# Patient Record
Sex: Male | Born: 1965
Health system: Western US, Academic
[De-identification: ages and names within clinical notes are randomized; demographics above are authoritative.]

---

## 2019-08-30 IMAGING — MR MRI BRAIN WITHOUT CONTRAST
6 of 8 series · 29 of 48 positions shown · non-contrast
Comparison: none

Pertinent Hx:  Closed head injury.
TECHNIQUE: T1, T2, FLAIR, and gradient echo weighted axials, T1 weighted sagittals, and T2 weighted coronals are performed through the brain. Additional diffusion weighted images are performed.

[Series 3: T1 · sagittal · 5.0mm · 0.47mm/px · 4 of 24 slices shown (1 of 2)]
[im 1/24]
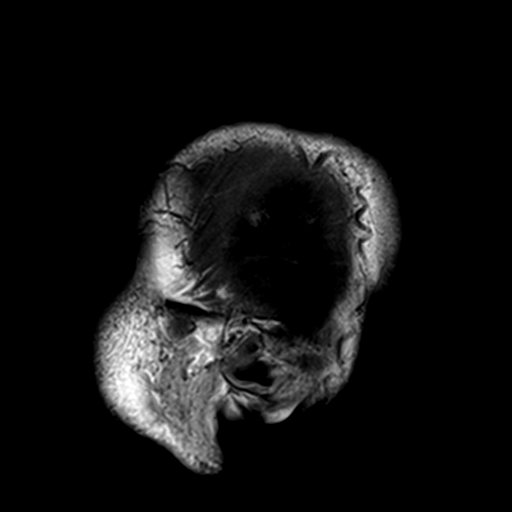
[im 8/24]
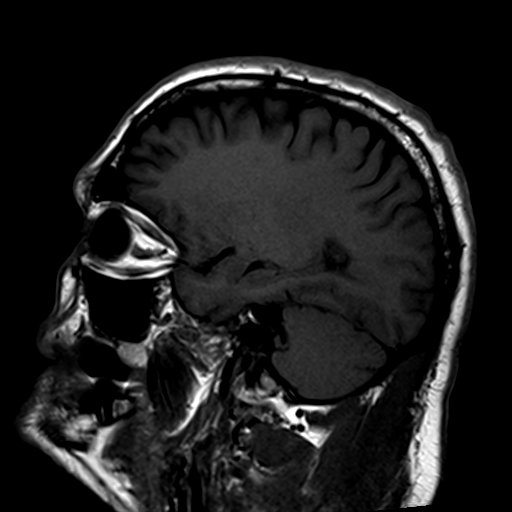
[im 16/24]
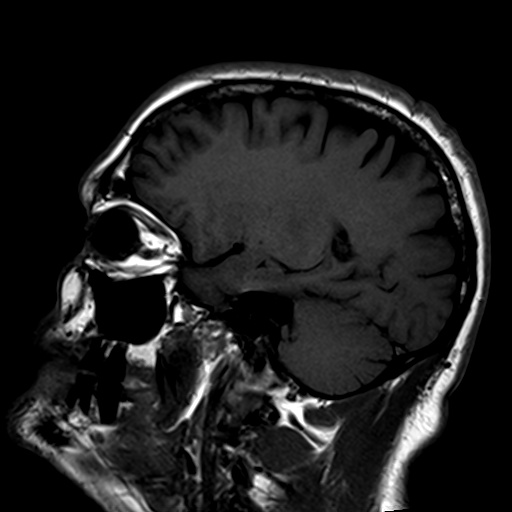
[im 24/24]
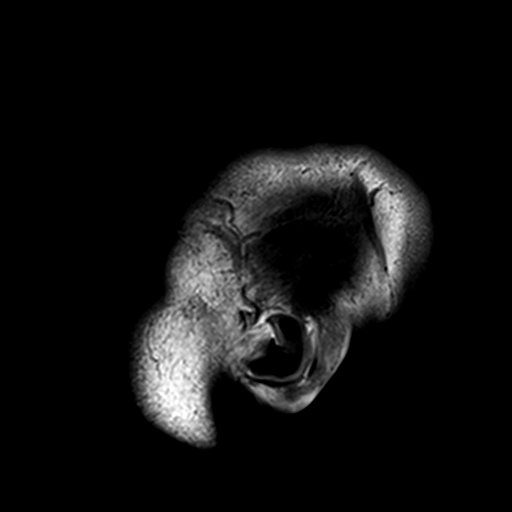

[Series 4: T2 · coronal · 5.0mm · 0.62mm/px · 5 of 28 slices shown (1 of 2)]
[im 1/28]
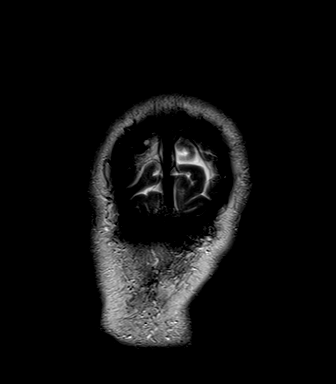
[im 7/28]
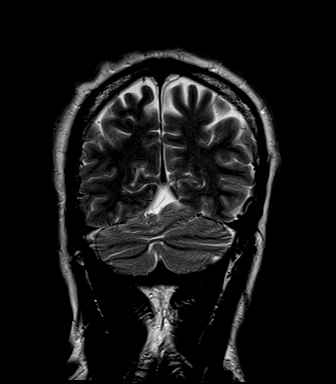
[im 14/28]
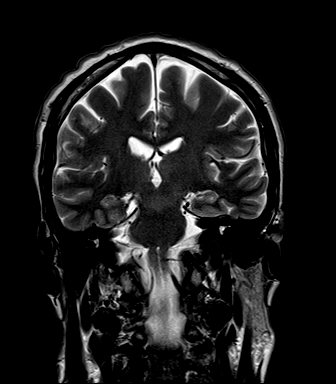
[im 21/28]
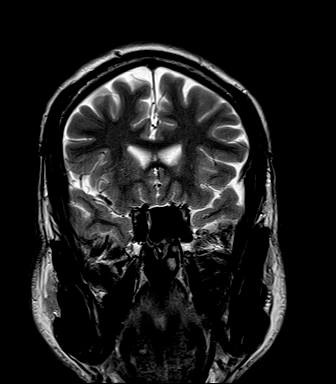
[im 28/28]
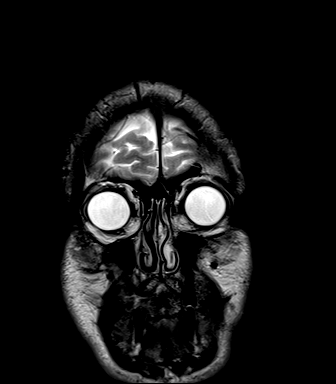

[Series 5: T2 · axial · 5.0mm · 0.62mm/px · z∈[-44,+86]mm · 5 of 26 slices shown (2 of 2)]
[im 1/26]
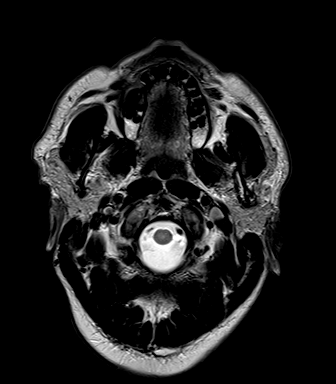
[im 7/26]
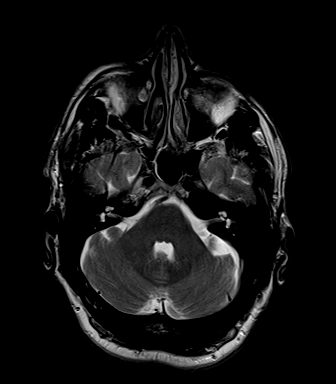
[im 13/26]
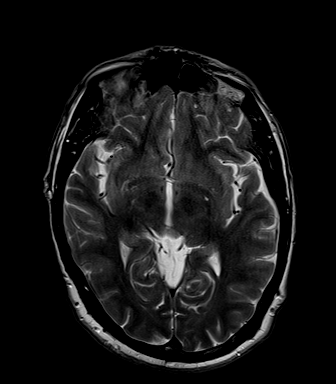
[im 19/26]
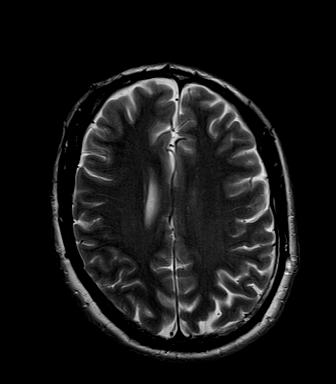
[im 26/26]
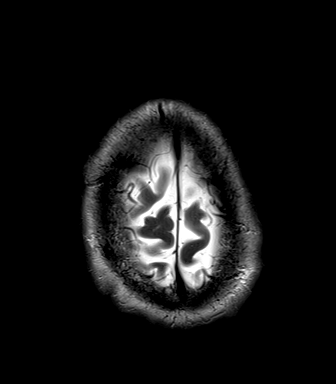

[Series 6: T1 · axial · 5.0mm · 0.62mm/px · z∈[-44,+86]mm · 5 of 26 slices shown (2 of 2)]
[im 1/26]
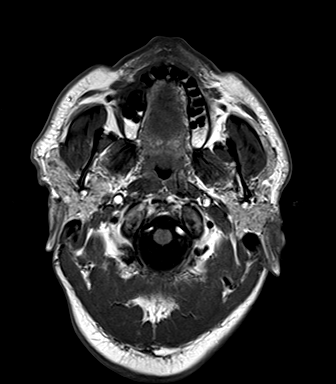
[im 7/26]
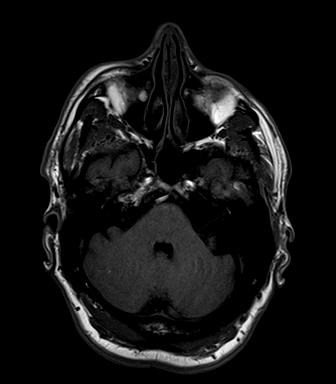
[im 13/26]
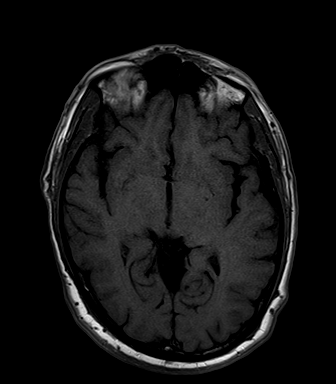
[im 19/26]
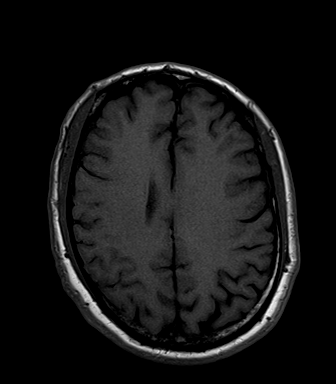
[im 26/26]
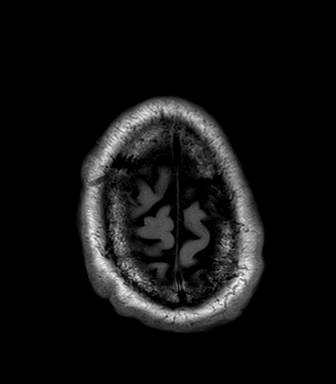

[Series 7: FLAIR · axial · 5.0mm · 0.47mm/px · z∈[-44,+86]mm · 5 of 26 slices shown]
[im 1/26]
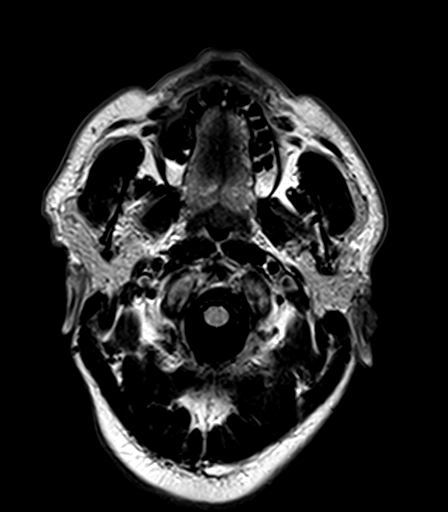
[im 7/26]
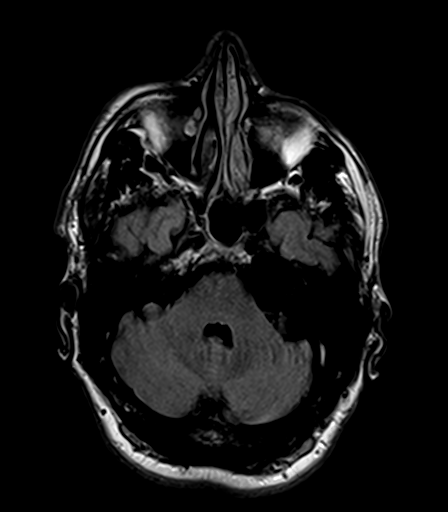
[im 13/26]
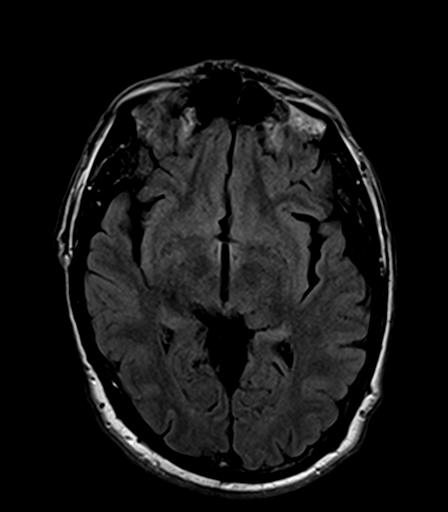
[im 19/26]
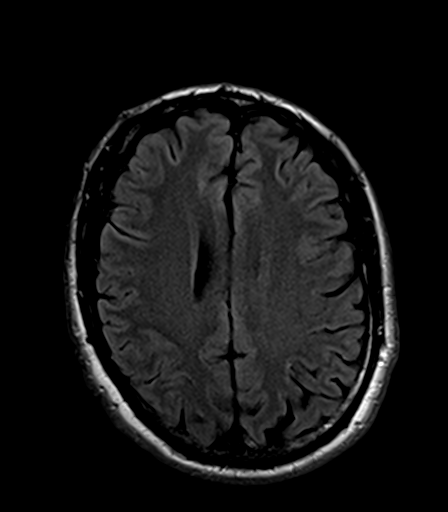
[im 26/26]
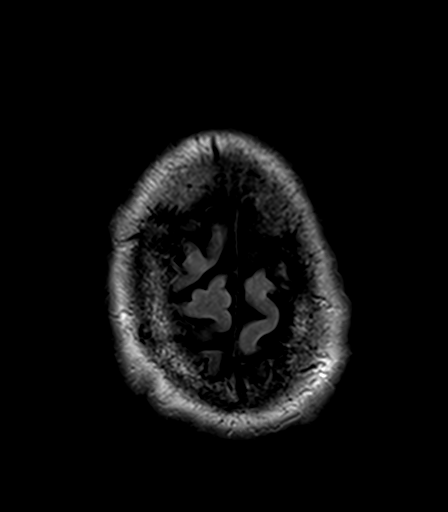

[Series 8: GRE · axial · 5.0mm · 0.47mm/px · z∈[-44,+86]mm · 5 of 26 slices shown]
[im 1/26]
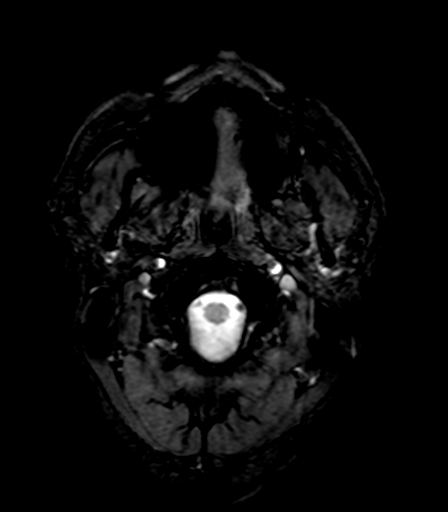
[im 7/26]
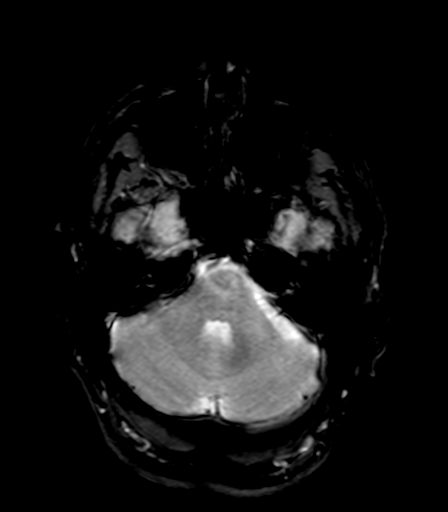
[im 13/26]
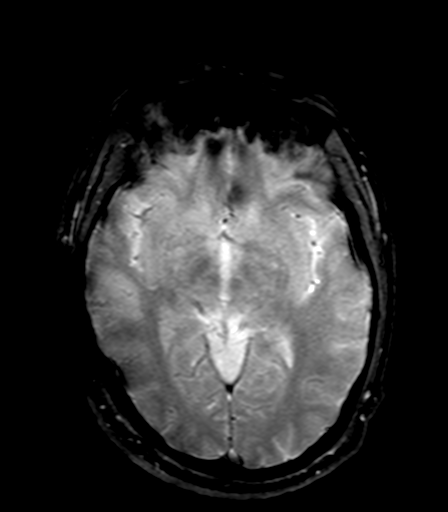
[im 19/26]
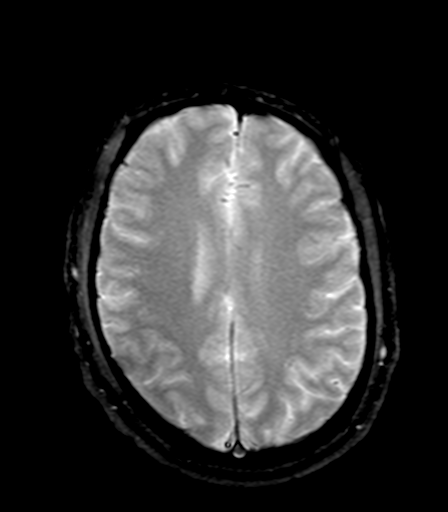
[im 26/26]
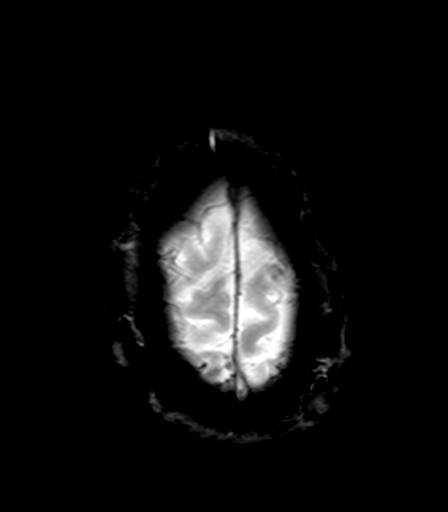

[29 of 48 positions shown; findings below may reference images not displayed]

FINDINGS: The fourth, third, and lateral ventricles are normal in size and location.  No intracranial abnormalities are seen.  Both eighth nerves are visualized and are normal.  The sinuses are clear.  Specifically, there is no evidence of recent brain injury.  

There is very mild frontal lobe atrophy with a mild degree of temporal lobe atrophy.  (Image 12 series 4.)
IMPRESSION: Essentially normal brain MRI with the exception of very mild atrophy in the frontal and temporal lobes slightly excessive that would be expected for a patient of age 54.

## 2020-02-20 IMAGING — MR MRI ABDOMEN WITHOUT CONTRAST
9 series · 48 of 48 positions shown · non-contrast
Comparison: none

﻿

Pertinent Hx:  Right-sided abdominal pain.
TECHNIQUE: Images were taken in axial and coronal planes.  T1 and T2-weighted images were obtained. In- and out-of-phase imaging was performed.  Diffusion-weighted images were also obtained.

[Series 1: 3 plane loc · axial · 7.0mm · 0.78mm/px · z∈[-30,+200]mm · 2 of 12 slices shown]
[im 1/12]
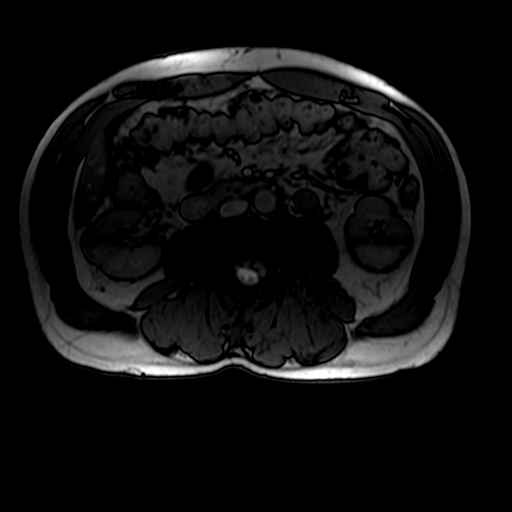
[im 12/12]
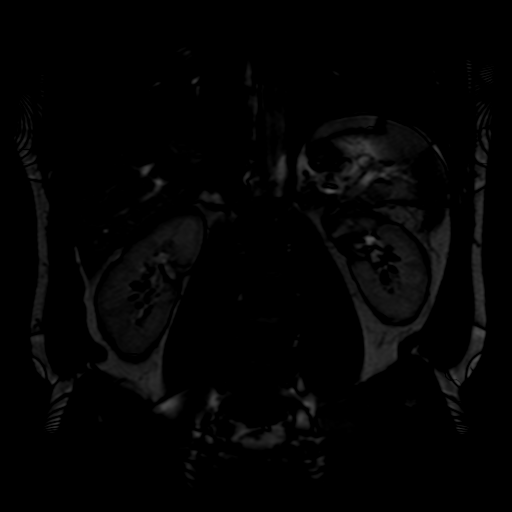

[Series 2: T2 · coronal · 6.0mm · 1.56mm/px · 4 of 32 slices shown (1 of 2)]
[im 1/32]
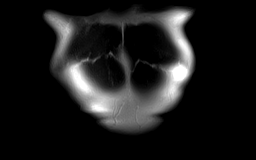
[im 11/32]
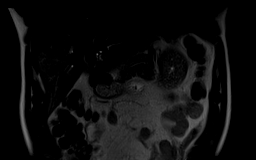
[im 21/32]
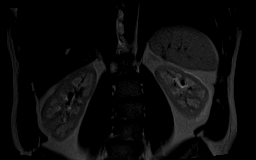
[im 32/32]
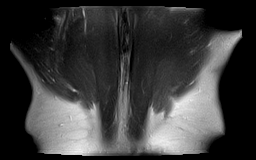

[Series 3: ax dual echo · axial · 5.0mm · 0.74mm/px · z∈[-111,+142]mm · 9 of 80 slices shown]
[im 1/80]
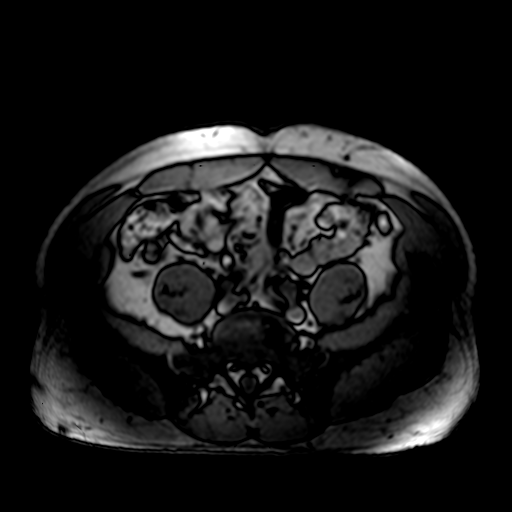
[im 10/80]
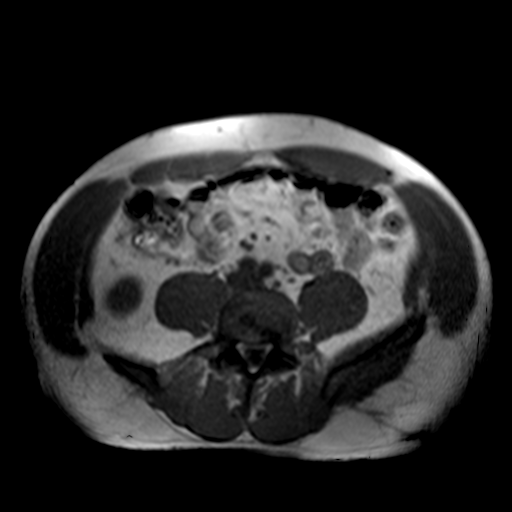
[im 20/80]
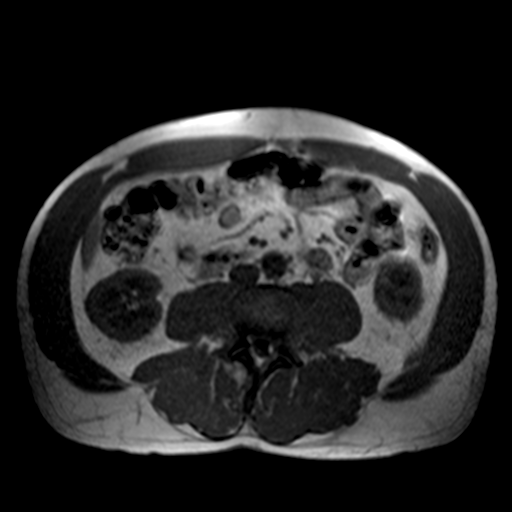
[im 30/80]
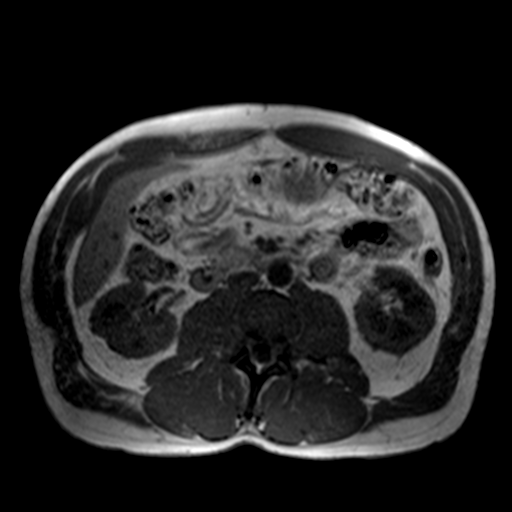
[im 40/80]
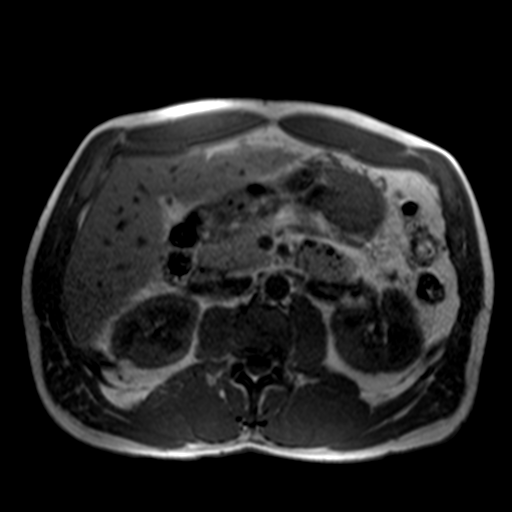
[im 50/80]
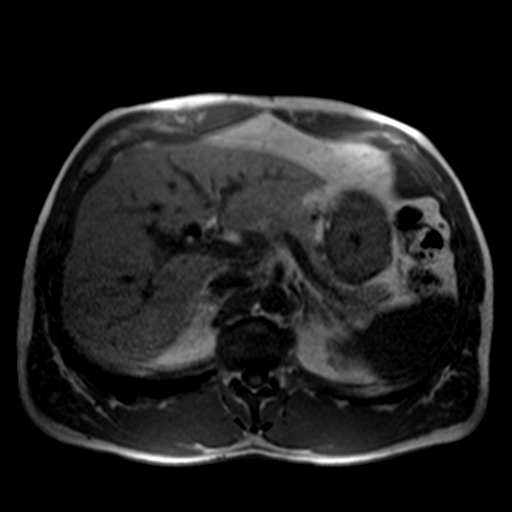
[im 60/80]
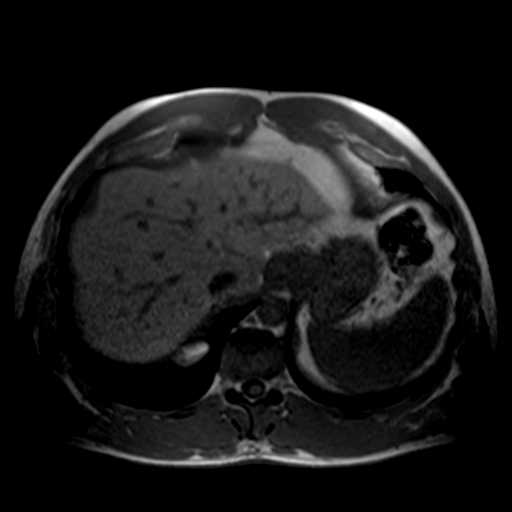
[im 70/80]
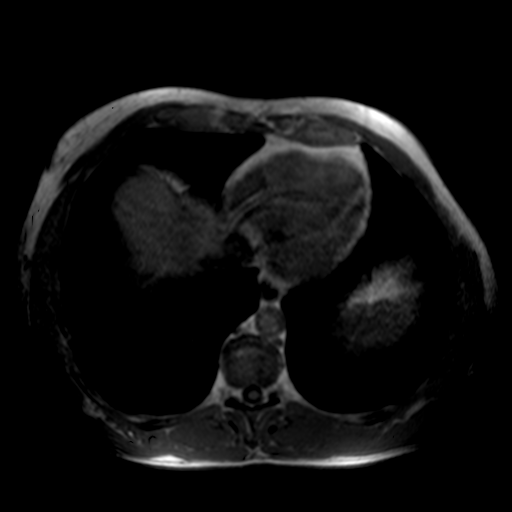
[im 80/80]
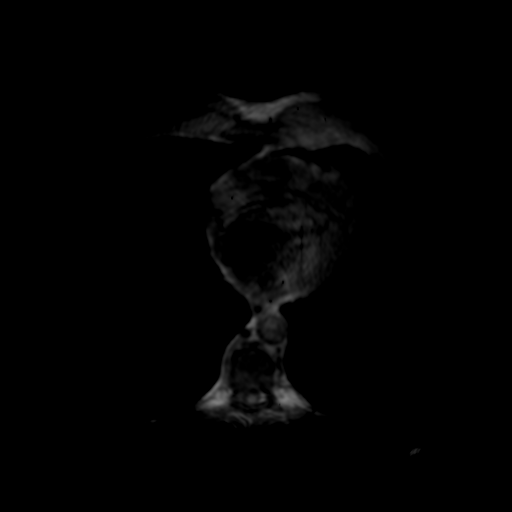

[Series 4: T2 · axial · 5.0mm · 1.48mm/px · z∈[-111,+142]mm · 4 of 40 slices shown (2 of 2)]
[im 1/40]
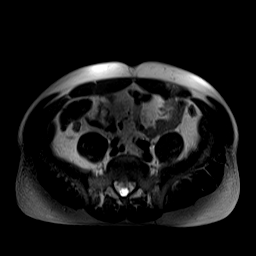
[im 14/40]
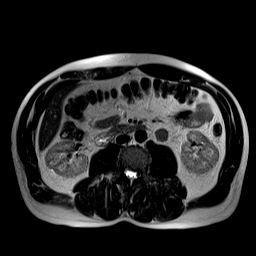
[im 27/40]
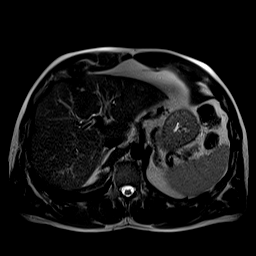
[im 40/40]
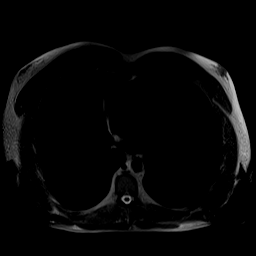

[Series 5: T2 fat-sat · axial · 5.0mm · 1.48mm/px · z∈[-111,+142]mm · 4 of 40 slices shown]
[im 1/40]
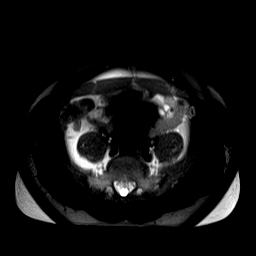
[im 14/40]
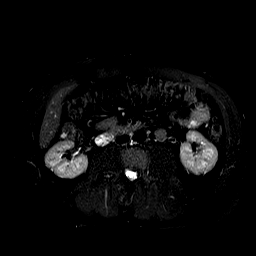
[im 27/40]
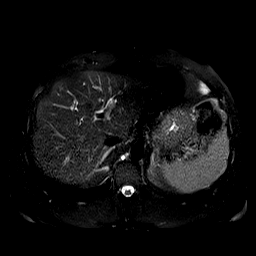
[im 40/40]
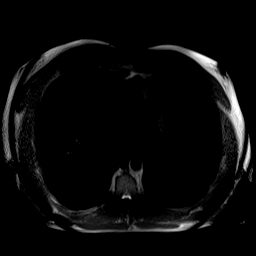

[Series 6: T1 dynamic · coronal · 4.0mm · 1.48mm/px · 6 of 60 slices shown (1 of 2)]
[im 1/60]
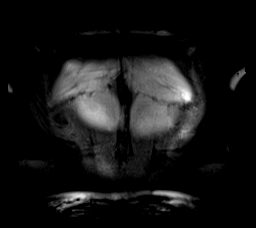
[im 12/60]
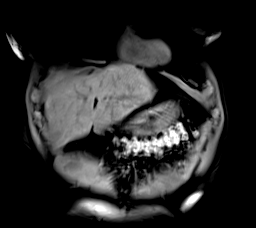
[im 24/60]
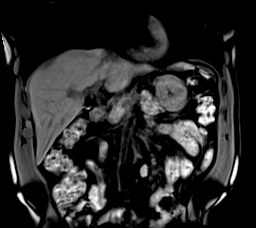
[im 36/60]
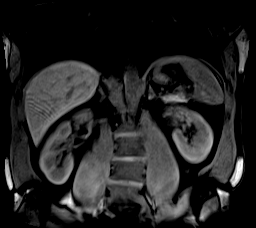
[im 48/60]
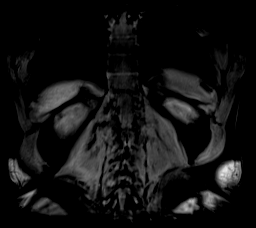
[im 60/60]
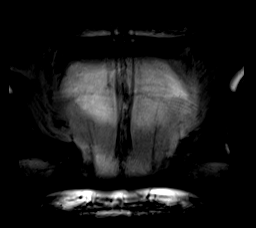

[Series 7: [id number]_800_(person_name)_(person_name)2_bh · axial · 5.0mm · 2.32mm/px · z∈[-111,+142]mm · 9 of 80 slices shown]
[im 1/80]
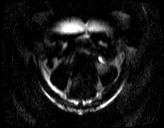
[im 10/80]
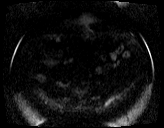
[im 20/80]
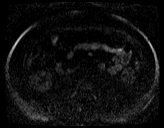
[im 30/80]
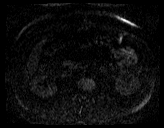
[im 40/80]
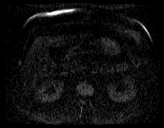
[im 50/80]
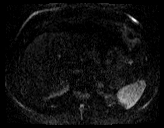
[im 60/80]
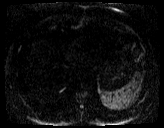
[im 70/80]
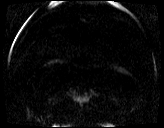
[im 80/80]
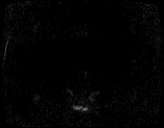

[Series 8: ep2d_[id number]_800_(person_name)_(person_name)2_bh_adc · axial · 5.0mm · 2.32mm/px · z∈[-111,+142]mm · 4 of 40 slices shown]
[im 1/40]
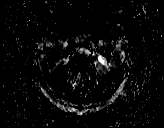
[im 14/40]
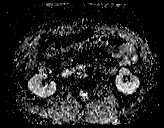
[im 27/40]
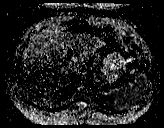
[im 40/40]
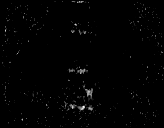

[Series 9: T1 dynamic · axial · 4.0mm · 1.48mm/px · z∈[-103,+133]mm · 6 of 60 slices shown (2 of 2)]
[im 1/60]
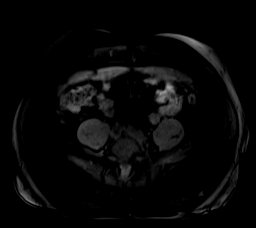
[im 12/60]
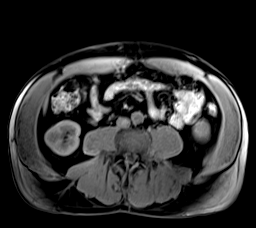
[im 24/60]
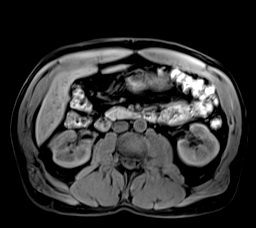
[im 36/60]
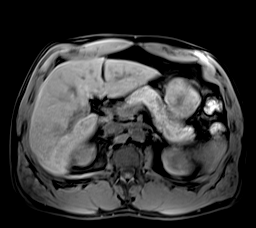
[im 48/60]
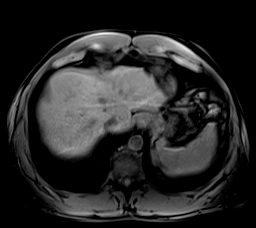
[im 60/60]
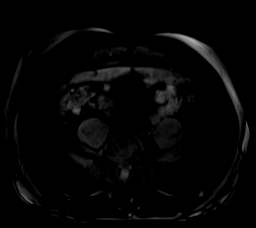

[48 of 48 positions shown; findings below may reference images not displayed]

FINDINGS: There is a small benign cyst measuring about 1 cm in diameter in the upper pole of the right kidney.  Kidneys otherwise appear normal.  No solid mass.  No hydronephrosis.  

Both adrenal glands appear normal.  

The gallbladder is absent.  There is a history of cholecystectomy.  There is no abnormal bile duct distention.  

The pancreas is normal.  The liver and spleen appear normal. 

No inflammatory changes identified in the upper abdomen.  

No retroperitoneal mass or lymphadenopathy.  

No evidence of ascites.
IMPRESSION: 1. Small benign cyst upper pole right kidney.

2. No other abnormality identified.  No acute findings.

## 2021-08-24 IMAGING — MR MRI SHOULDER LT W/O CONTRAST
7 series · 40 of 40 positions shown · non-contrast
Comparison: none

------------- REPORT [id number] -------------
﻿

Pertinent Hx:  Posterior shoulder and triceps pain.
TECHNIQUE: Proton density and T2-weighted coronal oblique images of the shoulder were taken.  Proton density sagittal oblique and axial images were also obtained.  Proton density and T2-weighted axial and proton density coronal images were taken of the proximal portion of the left upper extremity to about mid humeral level.

[Series 3: PD fat-sat · axial · 5.0mm · 0.64mm/px · z∈[-50,+56]mm · 5 of 20 slices shown (1 of 5)]
[im 1/20]
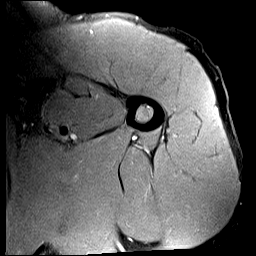
[im 5/20]
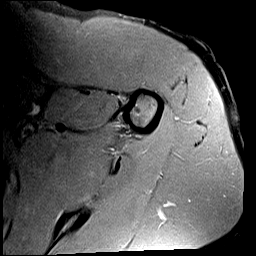
[im 10/20]
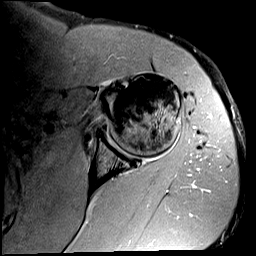
[im 15/20]
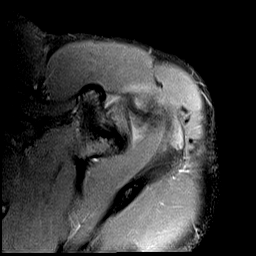
[im 20/20]
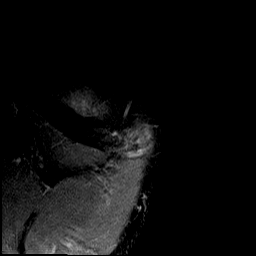

[Series 4: PD fat-sat · sagittal · 5.0mm · 0.66mm/px · 5 of 20 slices shown (2 of 5)]
[im 1/20]
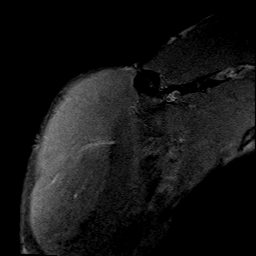
[im 5/20]
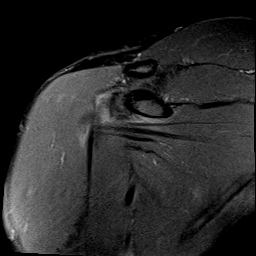
[im 10/20]
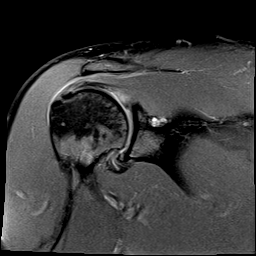
[im 15/20]
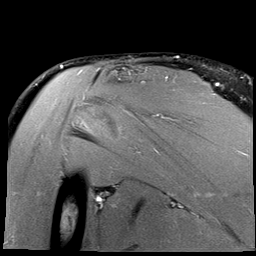
[im 20/20]
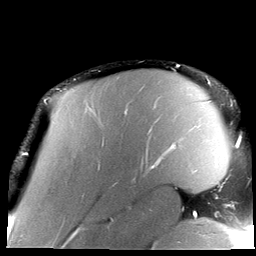

[Series 5: T2 · sagittal · 5.0mm · 0.53mm/px · 5 of 20 slices shown (1 of 2)]
[im 1/20]
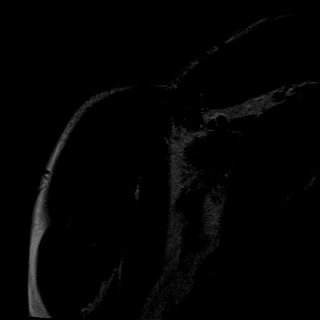
[im 5/20]
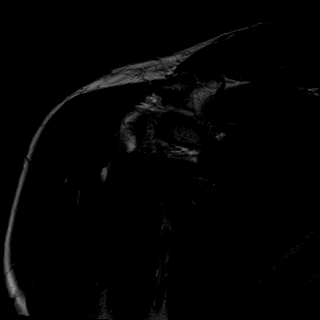
[im 10/20]
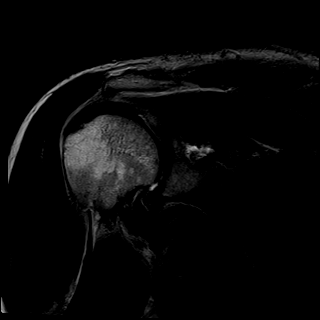
[im 15/20]
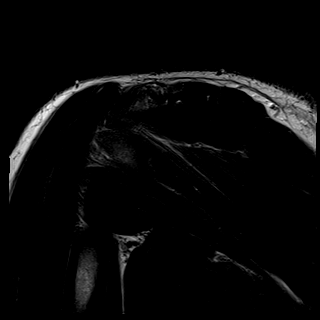
[im 20/20]
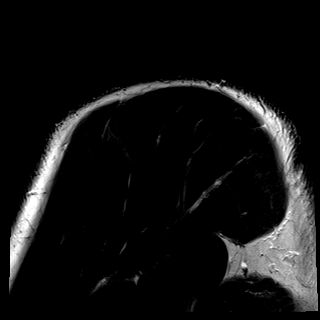

[Series 6: PD fat-sat · coronal · 5.0mm · 0.53mm/px · 6 of 23 slices shown (3 of 5)]
[im 1/23]
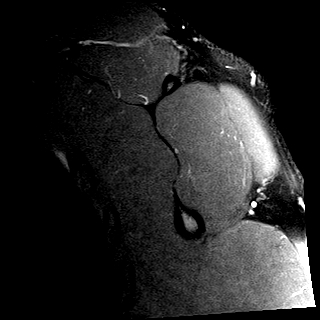
[im 5/23]
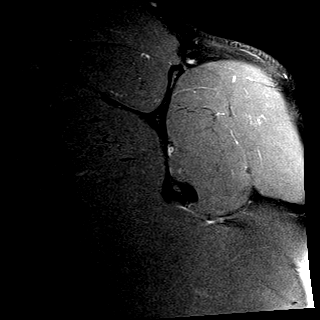
[im 9/23]
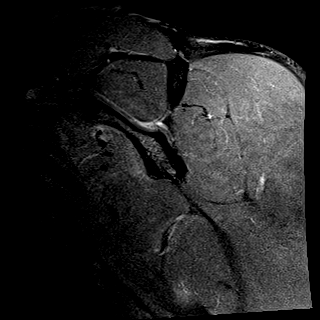
[im 14/23]
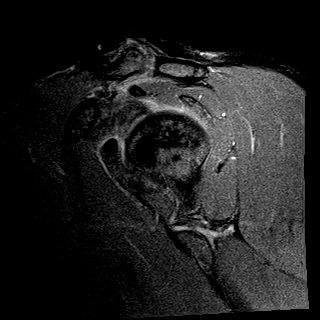
[im 18/23]
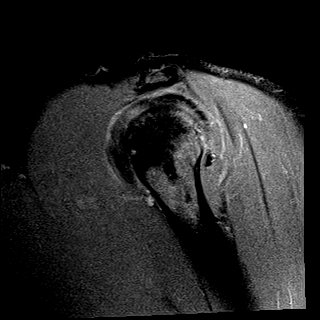
[im 23/23]
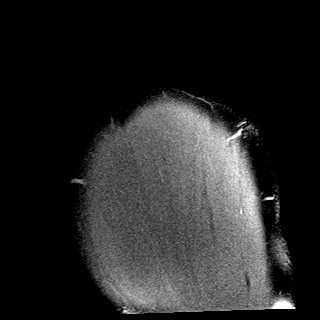

[Series 9: PD fat-sat · axial · 6.0mm · 0.70mm/px · z∈[-123,+49]mm · 7 of 25 slices shown (4 of 5)]
[im 1/25]
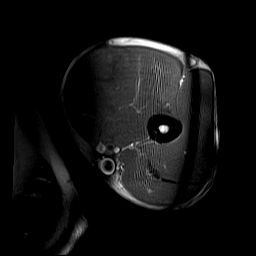
[im 5/25]
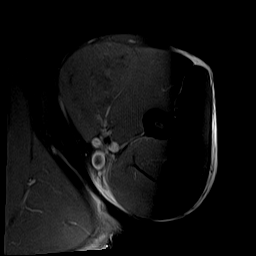
[im 9/25]
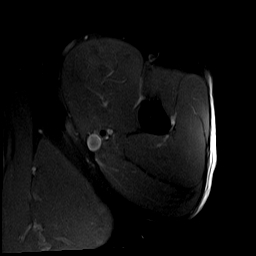
[im 13/25]
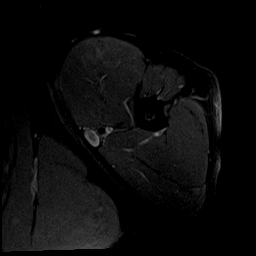
[im 17/25]
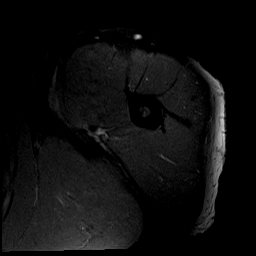
[im 21/25]
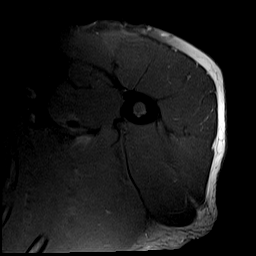
[im 25/25]
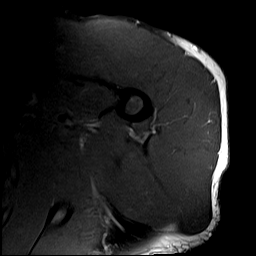

[Series 10: T2 · axial · 6.0mm · 0.56mm/px · z∈[-123,+49]mm · 7 of 25 slices shown (2 of 2)]
[im 1/25]
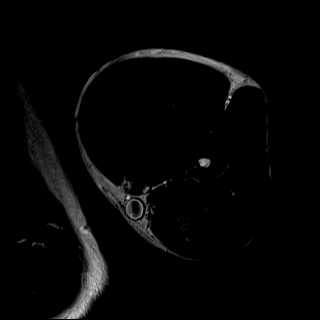
[im 5/25]
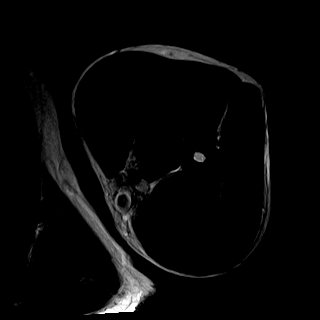
[im 9/25]
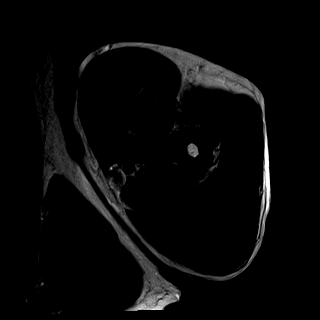
[im 13/25]
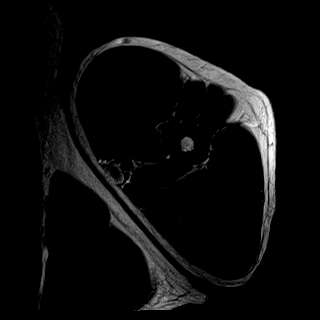
[im 17/25]
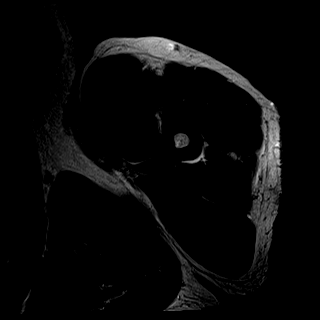
[im 21/25]
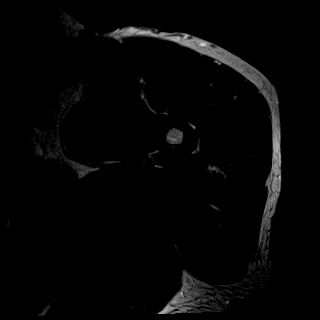
[im 25/25]
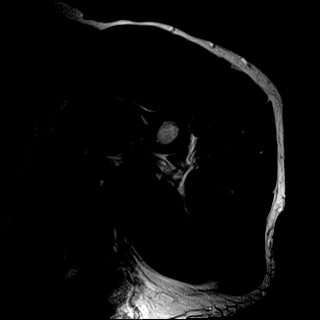

[Series 11: PD fat-sat · coronal · 6.0mm · 0.62mm/px · 5 of 20 slices shown (5 of 5)]
[im 1/20]
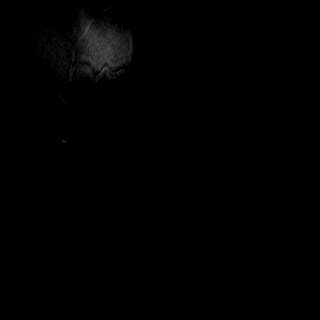
[im 5/20]
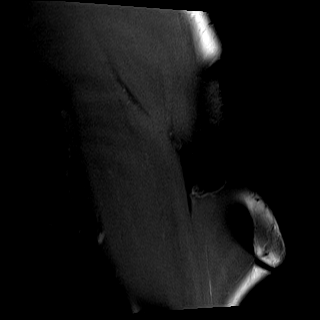
[im 10/20]
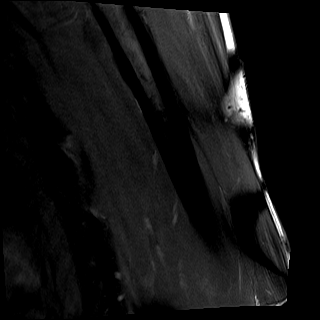
[im 15/20]
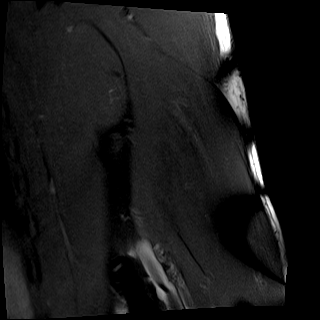
[im 20/20]
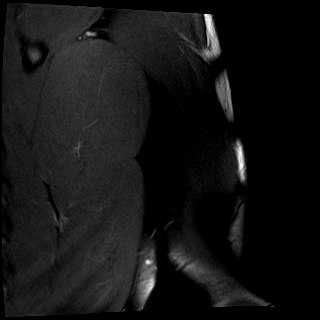

[40 of 40 positions shown; findings below may reference images not displayed]

FINDINGS: There is a shoulder joint effusion.  There is fluid within the subacromial-subdeltoid bursa.

There is a tear at the base of the posterior glenoid labrum.  This extends from the superior labrum to the inferior aspect of the joint and extends along the base of the inferior glenoid labrum.  There is no displacement.  

The biceps tendon appears intact and not torn.  

There is abnormal signal within the supraspinatus tendon at the humeral attachment.  This is consistent with tendinopathy.  There is a short, partial tear of the tendon just a few millimeters in length.  There is no evidence of complete tendon rupture.  Other rotator cuff tendons appear normal.  

No chondral defect identified at the glenohumeral joint.  No bony abnormality.  

There are acromioclavicular joint degenerative changes.  There is some reactive edematous change surrounding the AC joint.  

No muscular abnormality around the shoulder or in the proximal portion of the left upper extremity identified.  There is no muscular edema.  There is no atrophy.  

No other abnormality identified.  

Impression on page two
IMPRESSION: 1. Shoulder joint effusion.  Fluid in the subacromial-subdeltoid bursa.  

2. Tear at the base of the posterior glenoid labrum.  Extension of tear along the base of the inferior glenoid labrum without displacement.  

3. Supraspinatus tendinopathy.  Short, partial tear of the supraspinatus tendon.

------------- REPORT [id number] -------------
﻿

ADDENDUM:  

No abnormality in the proximal portions of the triceps muscle and latissimus dorsi can be identified.  Muscles show normal signal intensity.   

Pertinent Hx:  Posterior shoulder and triceps pain.
FINDINGS: There is a shoulder joint effusion.  There is fluid within the subacromial-subdeltoid bursa.

There is a tear at the base of the posterior glenoid labrum.  This extends from the superior labrum to the inferior aspect of the joint and extends along the base of the inferior glenoid labrum.  There is no displacement.  

The biceps tendon appears intact and not torn.  

There is abnormal signal within the supraspinatus tendon at the humeral attachment.  This is consistent with tendinopathy.  There is a short, partial tear of the tendon just a few millimeters in length.  There is no evidence of complete tendon rupture.  Other rotator cuff tendons appear normal.  

No chondral defect identified at the glenohumeral joint.  No bony abnormality.  

There are acromioclavicular joint degenerative changes.  There is some reactive edematous change surrounding the AC joint.  

No muscular abnormality around the shoulder or in the proximal portion of the left upper extremity identified.  There is no muscular edema.  There is no atrophy.  

No other abnormality identified.  

Impression on page two
IMPRESSION: 1. Shoulder joint effusion.  Fluid in the subacromial-subdeltoid bursa.  

2. Tear at the base of the posterior glenoid labrum.  Extension of tear along the base of the inferior glenoid labrum without displacement.  

3. Supraspinatus tendinopathy.  Short, partial tear of the supraspinatus tendon.

## 2021-12-22 ENCOUNTER — Telehealth: Payer: PRIVATE HEALTH INSURANCE

## 2021-12-22 NOTE — Telephone Encounter
Appointment Accommodation Request      Appointment Type: New Prostate Mass    Reason for sooner request: Pt interested in see Dr. Luetta Nutting. Pt requesting a VV appt if possible due to being out of state in [place]. Pt was diagnosed with a cancerous prostate tumor a couple of days ago. Pt will be faxing over medical records today 12/19/21.    Date/Time Requested (If any): VV if possible    Last seen by MD:     Any Symptoms:  '[x]'$  Yes  '[]'$  No       If yes, what symptoms are you experiencing:  cancerous prostate tumor  o Duration of symptoms (how long): a week ago    Patient or caller was offered an appointment but declined.    Patient or caller was advised to seek emergency services if conditions are urgent or emergent.    Patient or caller has been notified of the turnaround time of 1-2 business (days).

## 2022-01-10 ENCOUNTER — Telehealth: Payer: PRIVATE HEALTH INSURANCE

## 2022-01-10 DIAGNOSIS — C61 Malignant neoplasm of prostate: Secondary | ICD-10-CM

## 2022-01-10 NOTE — Consults
PATIENT: Jeremy Doyle  MRN: [id number]  DOB: Sep 09, 1965  DATE OF SERVICE: 01/10/2022    REFERRING PRACTITIONER: No ref. provider found  PRIMARY CARE PROVIDER: No primary care provider on file.    CHIEF COMPLAINT:     Prostate Cancer  Elevated PSA    HISTORY OF PRESENT ILLNESS:     56 y/o male who is from [place], [place] here for second opinion for prostate cancer.   Patient had a elevated PSA of 4.2 which led to a MRI in Bore Prostate Biopsy on 12/13/21.  3 cores were taken from the Right Mid and the biopsy revealed one core of Gleason 3+4=7 prostate cancer.   Patient was recommended on getting a prostatectomy but decline due to his age and side effect of the surgery.    Patient is interested in Focal Therapy     In Bore Prostate Biopsy on 12/13/21 ([facility] in [place])  Right Mid: 3+4=7, 1/3 cores, 1%    MRI on 12/13/21  Impression:  Successful MRI Guided Biopsy of a Right Posterior Peripheral Zone Right Midportion Prostate Nodule as Described Above.     PSA Trend:  3.5 on 11/11/21  4.2 on 09/29/21  3.4 on 09/01/21  3.2 on 07/30/21    REVIEW OF SYSTEMS:     14-point check is negative except as indicated above.    MEDICAL AND SURGICAL HISTORY:       No past medical history on file.   No past surgical history on file.      SOCIAL HISTORY:     Stress: Not on file            FAMILY HISTORY:     No family history on file.         ALLERGIES:     Not on File    MEDICATIONS:     No outpatient medications prior to visit.     No facility-administered medications prior to visit.         EXAM     General: Alert, well-developed, and in no distress.     Psychiatric: Orientation and mood are appropriate.    DIAGNOSTIC REVIEW                 No results found for: ''PSATOTAL''    MRI REVIEW:    No results found for this or any previous visit.        ASSESSMENT & PLAN     Assessment:  Prostate Cancer  Elevated PSA          Plan:  Schedule for MRI or Prostate and Prostate Biopsy - Patient will call to make this appt. (he is from [place])  [employer] Reviewed Labs/Notes from outside records., Discussed medication regimen. and Discussed biopsy results.                        Face-to-face time allocation:  45 minutes      Jolee Ewing, M.D.   Diplomate, Biomedical engineer of Urology    11:02 AM  01/10/2022

## 2022-06-09 IMAGING — MR ARTHROGRAM MRI RT SHOULDER
5 series · 40 of 40 positions shown · IV contrast (multihance)
Comparison: none

﻿

Pertinent Hx:    Shoulder pain.
TECHNIQUE: Using sterile technique and local anesthesia and with fluoroscopic guidance, a 20-gauge needle was placed into the shoulder.  Approximately 15 cc of a dilute mixture of MultiHance mixed in normal saline were injected into the shoulder along with a small amount of [id number].  Fluoro time:  0.1 min.  
T1 and T2-weighted coronal oblique, T1-weighted sagittal oblique, and T1 and T2-weighted axial images of the shoulder were taken.

[Series 3: T1 · axial · 4.0mm · 0.50mm/px · z∈[-3,+96]mm · 9 of 22 slices shown]
[im 1/22]
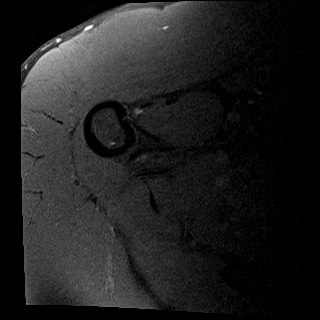
[im 3/22]
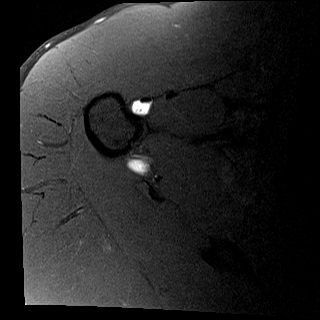
[im 6/22]
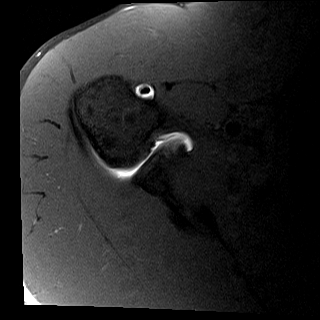
[im 8/22]
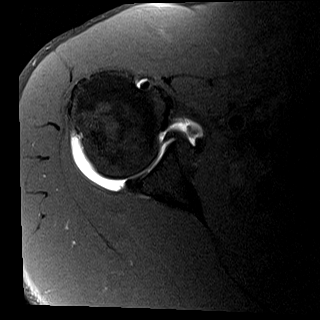
[im 11/22]
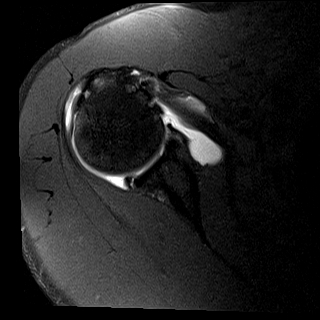
[im 14/22]
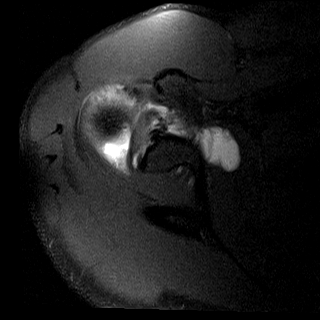
[im 16/22]
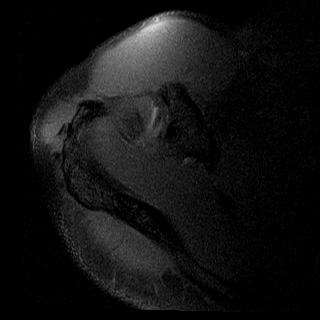
[im 19/22]
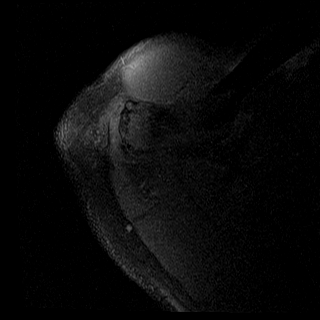
[im 22/22]
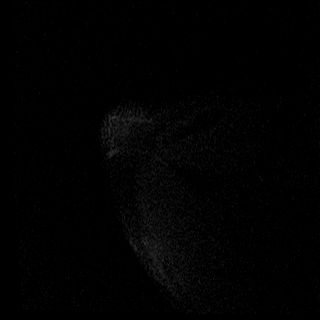

[Series 4: T1 fat-sat · oblique · 5.0mm · 0.53mm/px · 8 of 19 slices shown (1 of 2)]
[im 1/19]
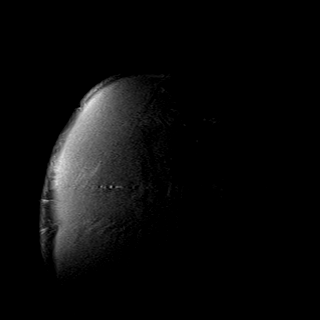
[im 3/19]
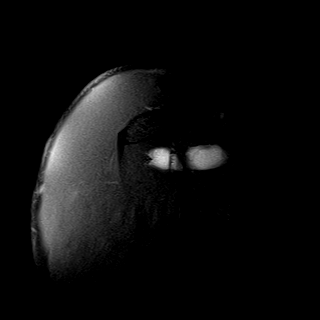
[im 6/19]
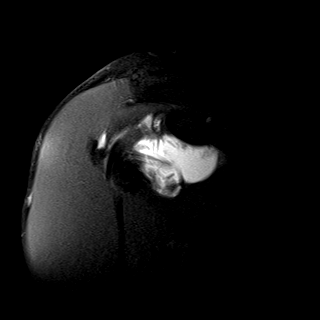
[im 8/19]
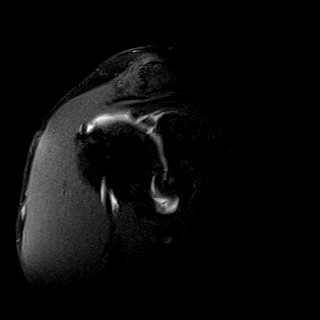
[im 11/19]
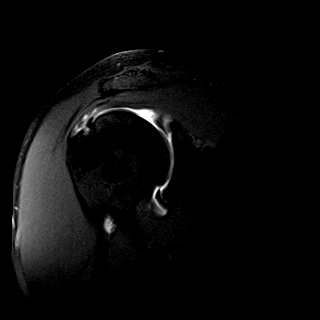
[im 13/19]
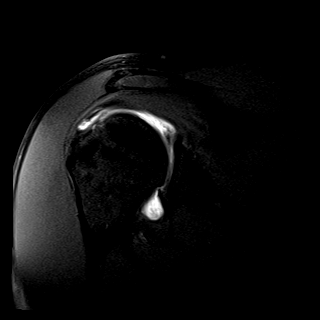
[im 16/19]
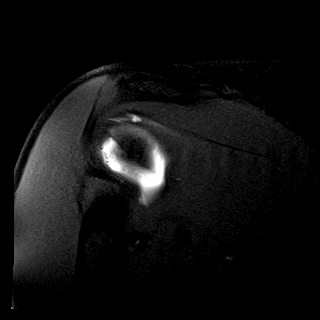
[im 19/19]
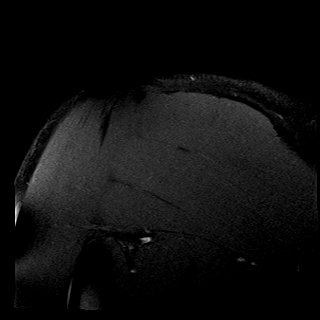

[Series 5: T2 fat-sat · oblique · 5.0mm · 0.53mm/px · 7 of 19 slices shown]
[im 1/19]
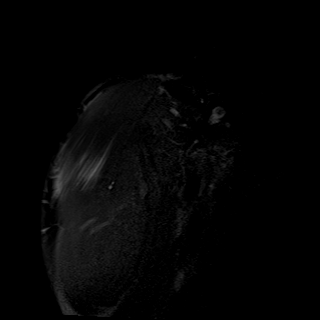
[im 4/19]
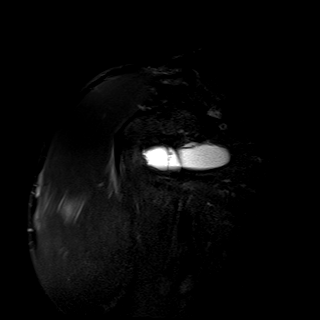
[im 7/19]
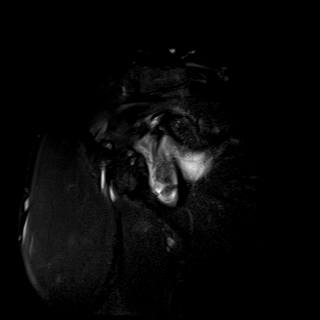
[im 10/19]
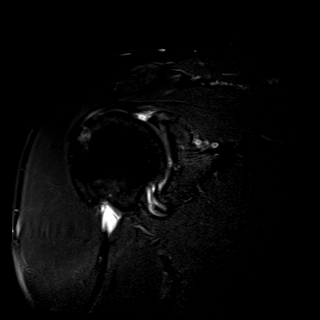
[im 13/19]
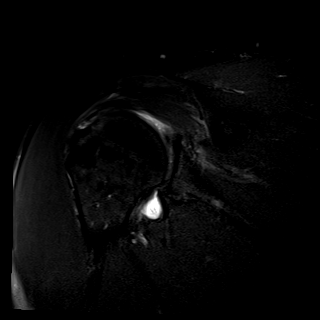
[im 16/19]
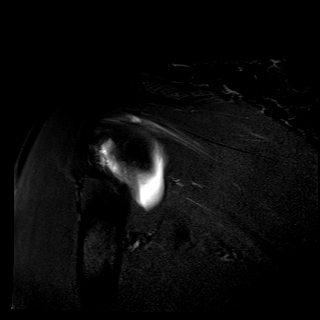
[im 19/19]
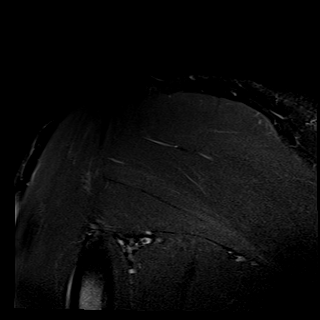

[Series 6: T1 fat-sat · oblique · 5.0mm · 0.53mm/px · 8 of 22 slices shown (2 of 2)]
[im 1/22]
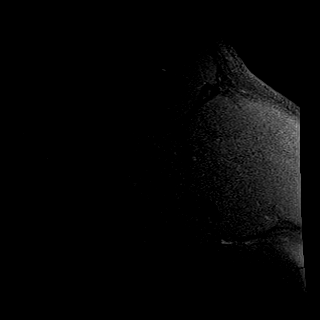
[im 4/22]
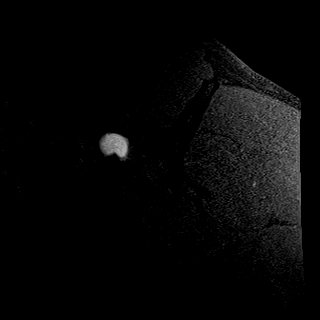
[im 7/22]
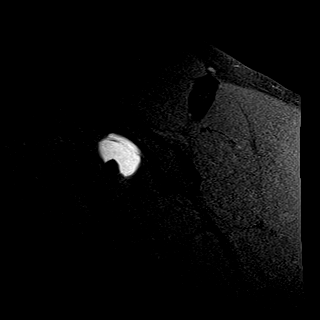
[im 10/22]
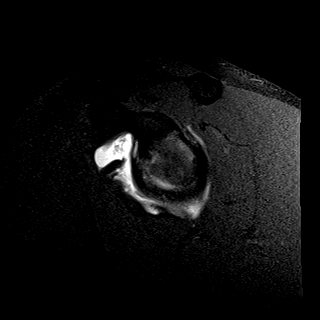
[im 13/22]
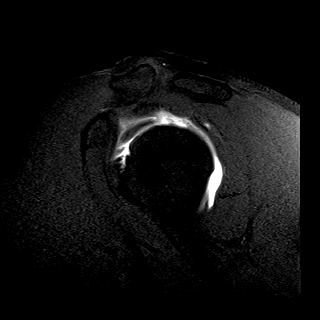
[im 16/22]
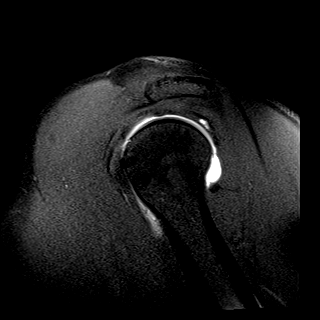
[im 19/22]
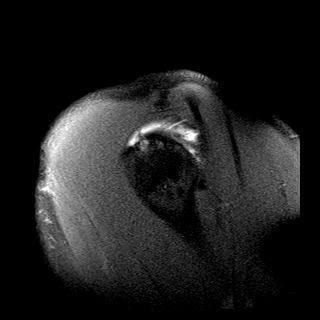
[im 22/22]
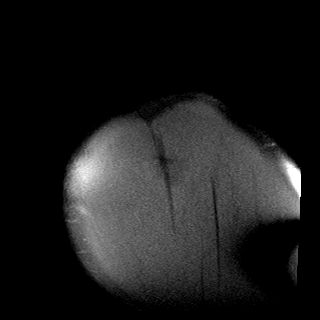

[Series 7: T2 · axial · 4.0mm · 0.50mm/px · z∈[-3,+96]mm · 8 of 22 slices shown]
[im 1/22]
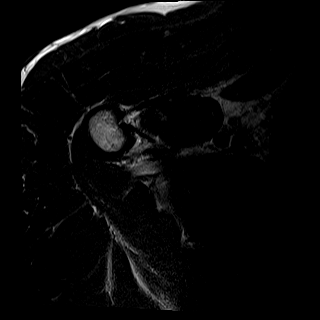
[im 4/22]
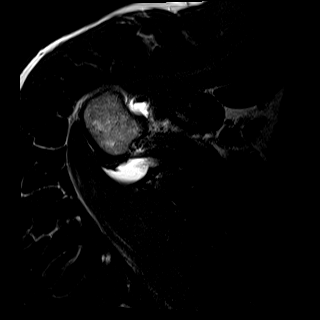
[im 7/22]
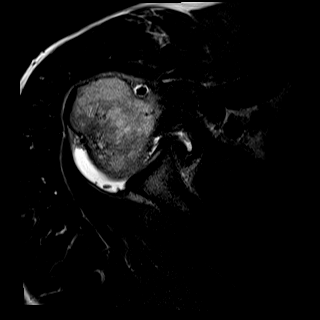
[im 10/22]
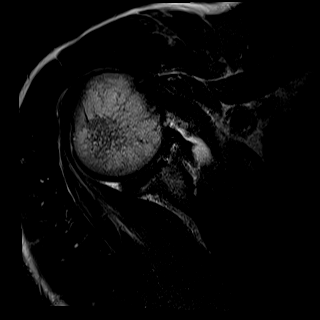
[im 13/22]
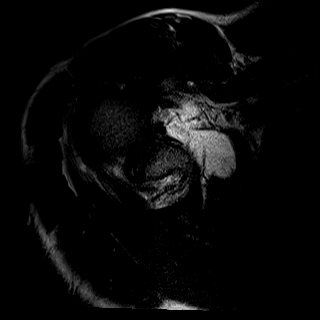
[im 16/22]
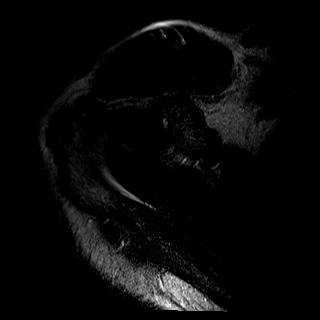
[im 19/22]
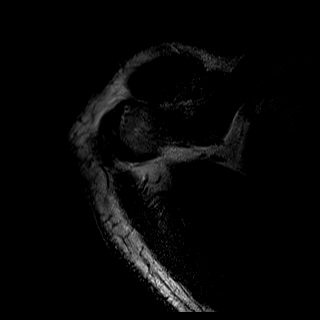
[im 22/22]
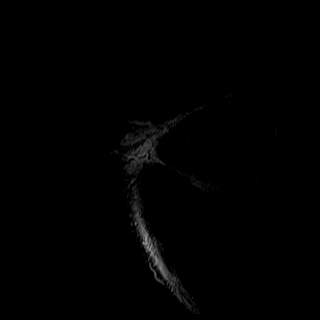

[40 of 40 positions shown; findings below may reference images not displayed]

FINDINGS: There is an extensive partial thickness tear of the supraspinatus tendon.  The tear is almost full thickness.  It involves almost the entire thickness of the tendon without displacement.  

There is a partial tear of the infraspinatus tendon.  This tear is also close to being full thickness.  The tear extends within the substance of the tendon to the musculotendinous junction.  

There is a bucket handle type tear of the superior glenoid labrum.  No definite tear of the biceps tendon itself can be identified.  The tear extends along the base of both the posterior and anterior portions of the glenoid labrum almost to the inferior aspect of the joint.  

There is a focal area of chondral loss in the glenoid anteriorly and there are small subchondral cysts.  A chondral defect is also present on the head of the humerus.  

No muscular atrophy is evident.  

There are acromioclavicular joint degenerative changes.  

No other abnormality can be identified.  

Continued page 2…
IMPRESSION: 1. Rotator cuff tear.  There are extensive partial tears of the supraspinatus and infraspinatus tendons.  Both tendon tears are close to being full thickness.  

2. Type 3 SLAP tear.  Extension of tear along the base of the posterior labrum and also the anterior labrum to the inferior aspect of the joint. 

3. Osteoarthritis glenohumeral joint.  

4. No other tear identified.

## 2022-06-28 IMAGING — MR MRI BRAIN W/O CONTRAST
7 of 9 series · 37 of 48 positions shown · non-contrast
Comparison: none

﻿

Pertinent Hx:    Dizziness, right head pressure.
TECHNIQUE: T1, T2, FLAIR, and gradient echo weighted axials, T1 weighted sagittals, and T2 weighted coronals are performed through the brain. Diffusion weighted images are performed.  Additional thin sections are performed with T2-weighting through the 8th nerves.

[Series 2: T1 · sagittal · 5.0mm · 0.47mm/px · 6 of 24 slices shown (1 of 2)]
[im 1/24]
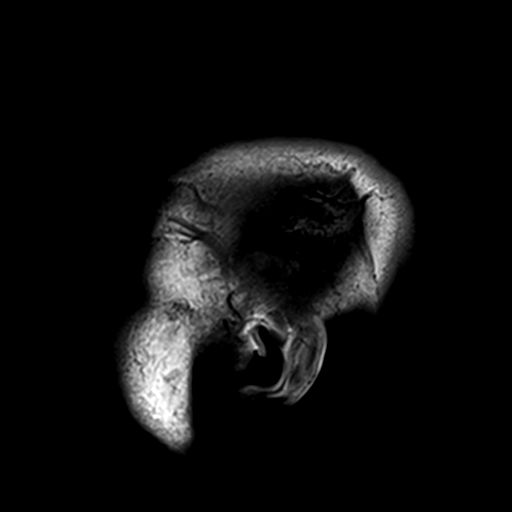
[im 5/24]
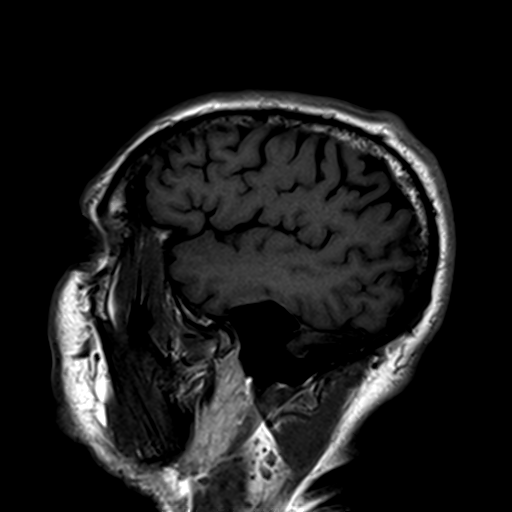
[im 10/24]
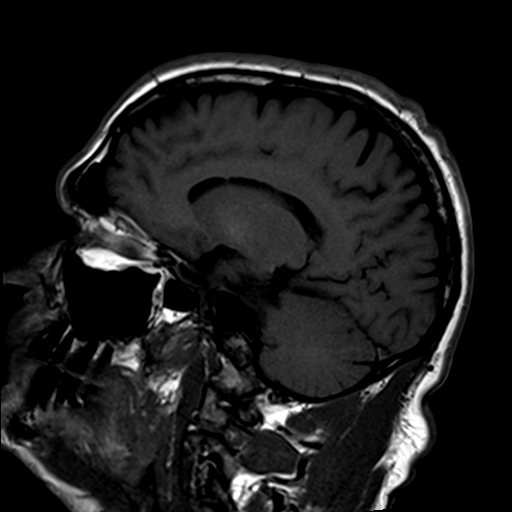
[im 14/24]
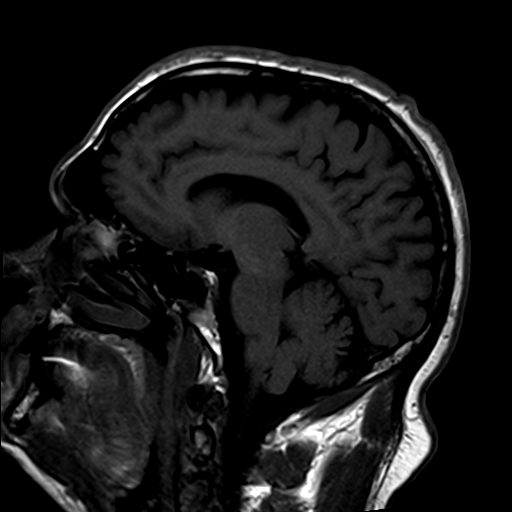
[im 19/24]
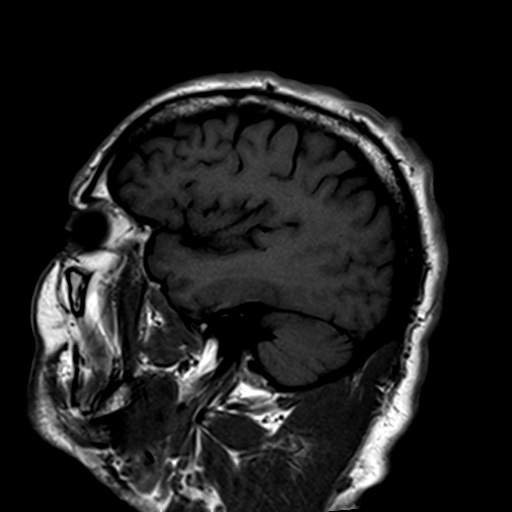
[im 24/24]
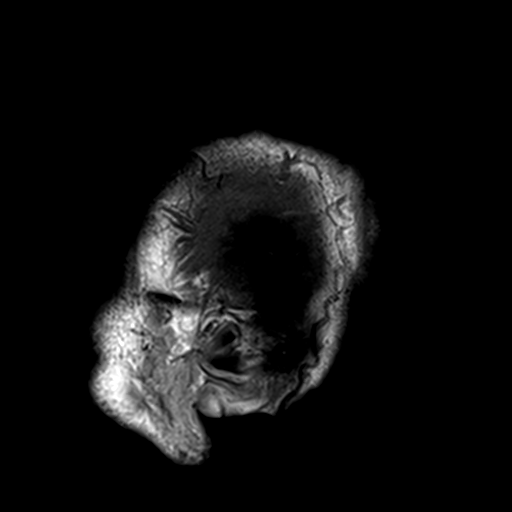

[Series 3: T2 · coronal · 5.0mm · 0.57mm/px · 6 of 28 slices shown (1 of 2)]
[im 1/28]
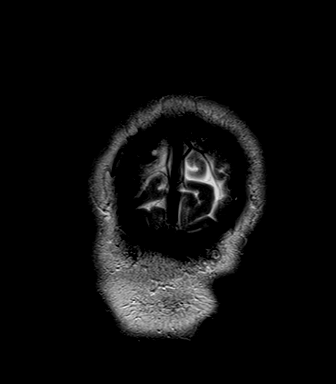
[im 6/28]
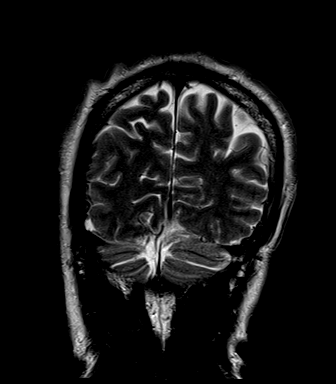
[im 11/28]
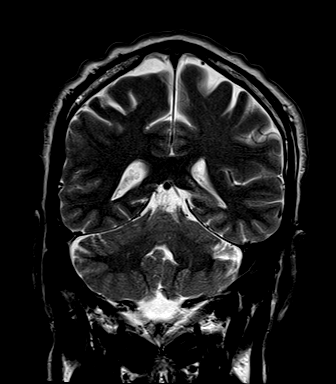
[im 17/28]
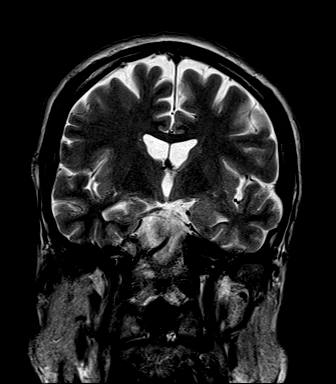
[im 22/28]
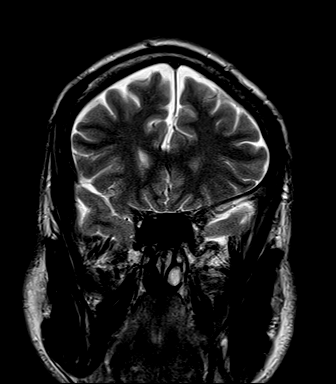
[im 28/28]
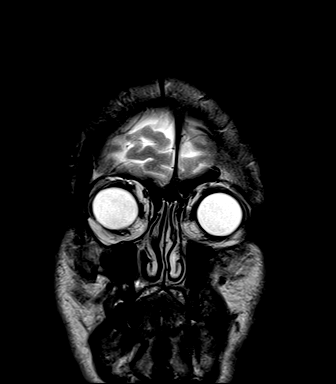

[Series 4: T2 · axial · 5.0mm · 0.62mm/px · z∈[-32,+99]mm · 5 of 26 slices shown (2 of 2)]
[im 1/26]
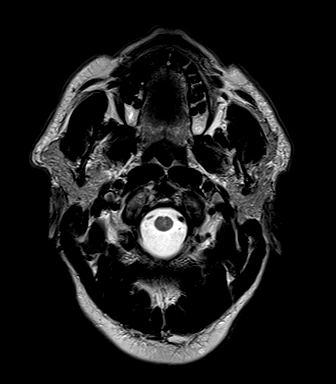
[im 7/26]
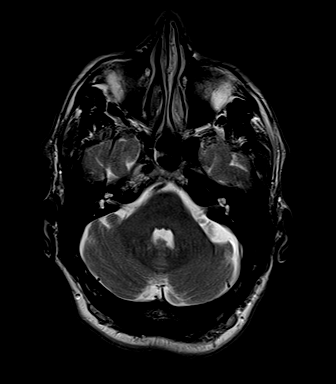
[im 13/26]
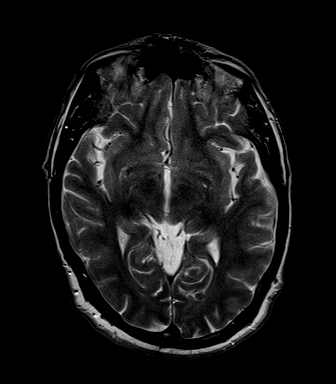
[im 19/26]
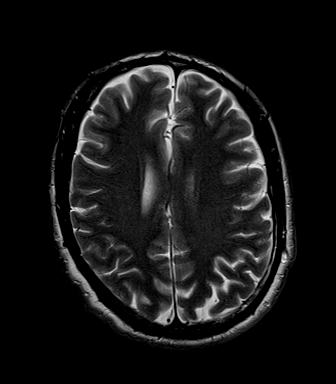
[im 26/26]
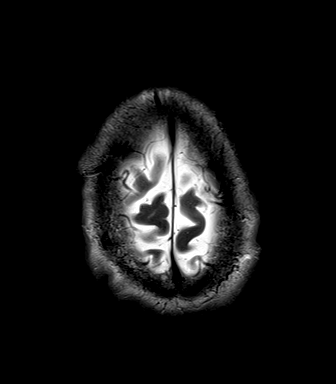

[Series 5: T1 · axial · 5.0mm · 0.75mm/px · z∈[-32,+99]mm · 5 of 26 slices shown (2 of 2)]
[im 1/26]
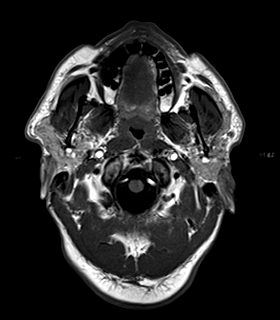
[im 7/26]
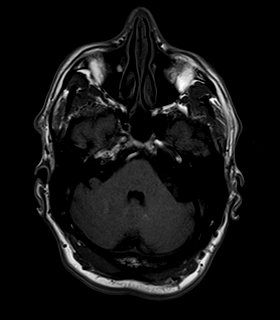
[im 13/26]
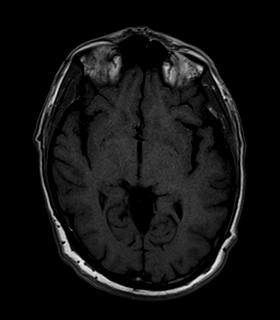
[im 19/26]
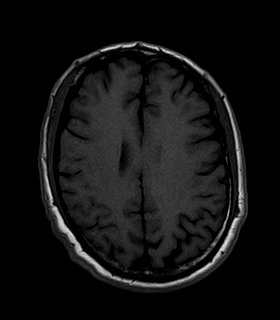
[im 26/26]
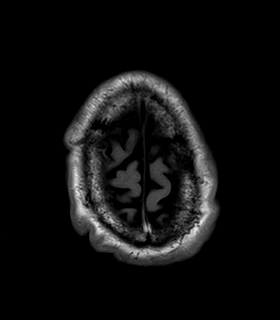

[Series 6: FLAIR · axial · 5.0mm · 0.47mm/px · z∈[-32,+99]mm · 5 of 26 slices shown]
[im 1/26]
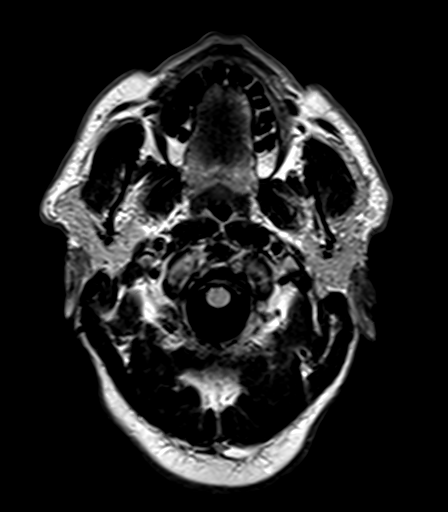
[im 7/26]
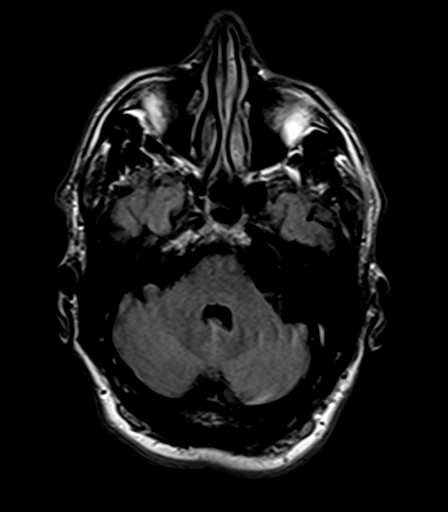
[im 13/26]
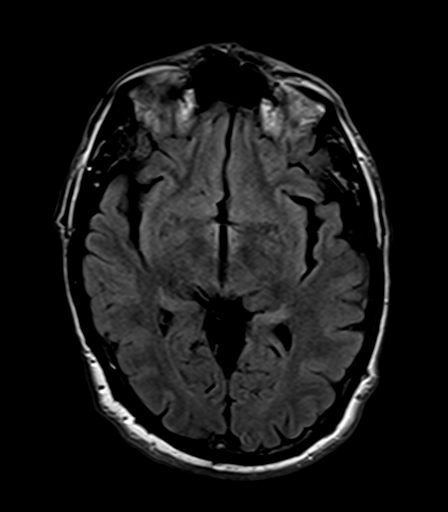
[im 19/26]
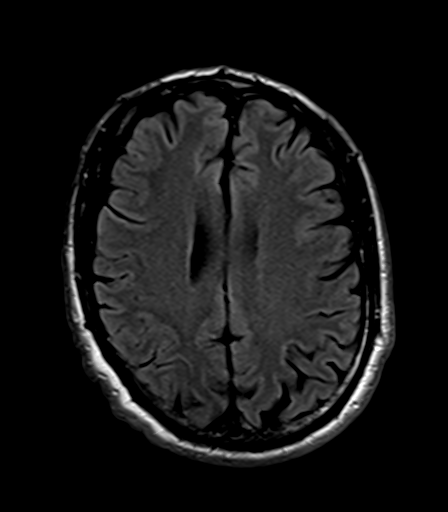
[im 26/26]
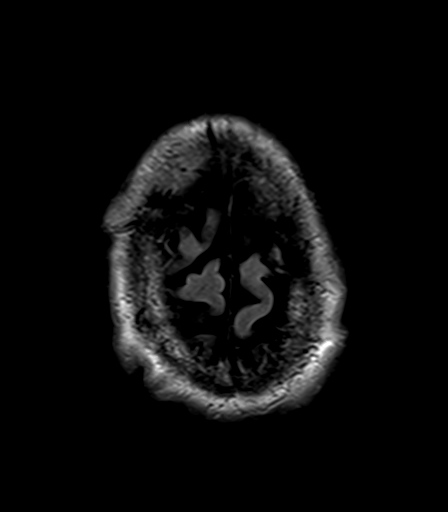

[Series 7: GRE · axial · 5.0mm · 0.75mm/px · z∈[-32,+99]mm · 5 of 26 slices shown]
[im 1/26]
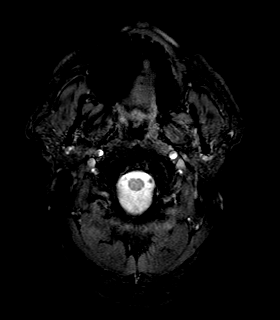
[im 7/26]
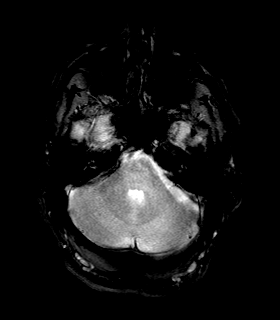
[im 13/26]
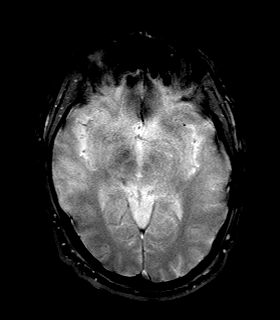
[im 19/26]
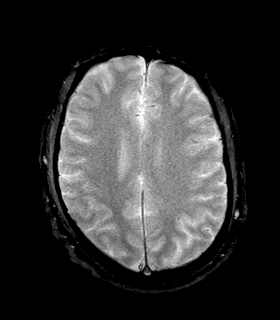
[im 26/26]
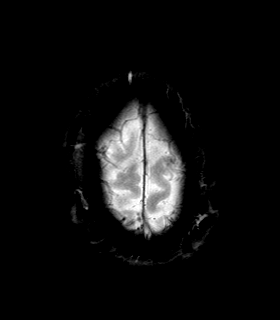

[Series 9: dfsn/t/se/epi/fsat · axial · 5.0mm · 1.88mm/px · z∈[-31,+100]mm · 5 of 26 slices shown]
[im 1/26]
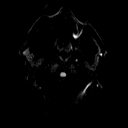
[im 7/26]
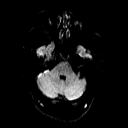
[im 13/26]
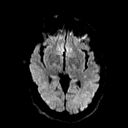
[im 19/26]
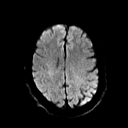
[im 26/26]
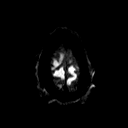

[37 of 48 positions shown; findings below may reference images not displayed]

FINDINGS: The fourth, third and lateral ventricles are normal in size and location.  There continues to be a very small amount of frontal lobe atrophy which was present on the prior scan and has not changed.  There is really no significant temporal lobe atrophy on the current scan and on the prior scan it was very minimal as well.  No 8th nerve pathology is identified.  The maxillary sinuses are clear.
IMPRESSION: Minimally abnormal due to persistent frontal lobe atrophy that was described on a prior scan 08/30/2019.  No 8th nerve pathology is seen.  No other new or acute change is present on this study.

## 2023-03-15 IMAGING — MR MRA HEAD WITHOUT CONTRAST
1 series · 20 of 48 positions shown · non-contrast
Comparison: none

﻿

Pertinent Hx:    Transient loss of vision left eye.  Assess for stroke.
TECHNIQUE: 3D time of flight images are performed through the intracranial circulation in the collapsed axial and true coronal plane.

[Series 3: tof_3d_multi-slab · axial · 0.6mm · 0.39mm/px · z∈[-57,+58]mm · 20 of 186 slices shown]
[im 1/186]
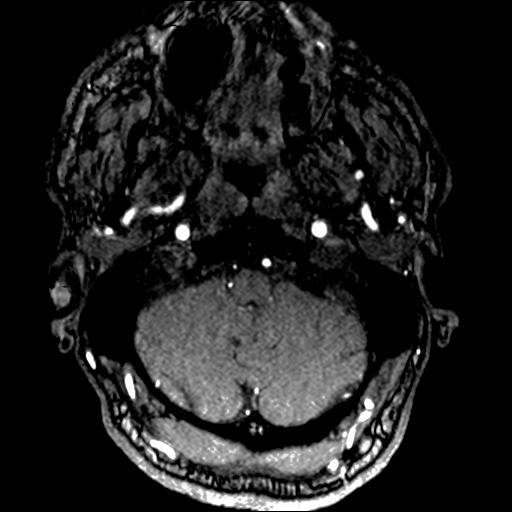
[im 4/186]
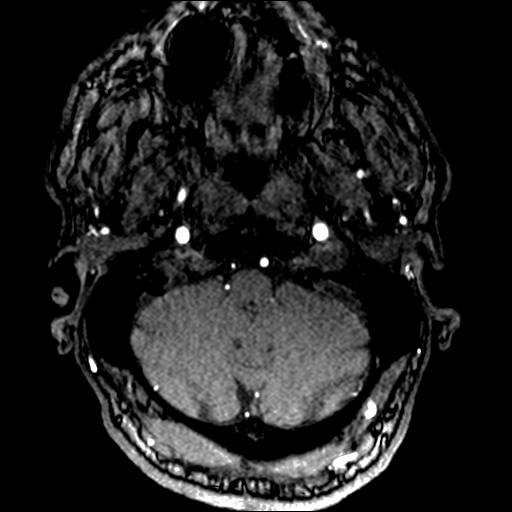
[im 8/186]
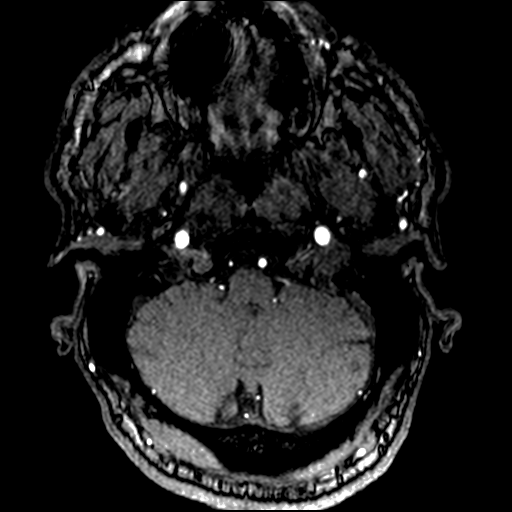
[im 12/186]
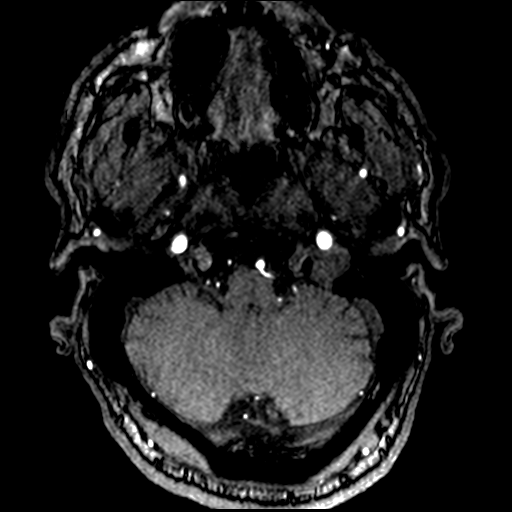
[im 16/186]
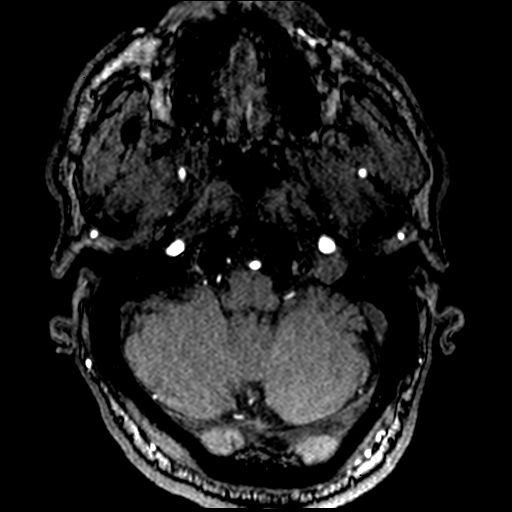
[im 20/186]
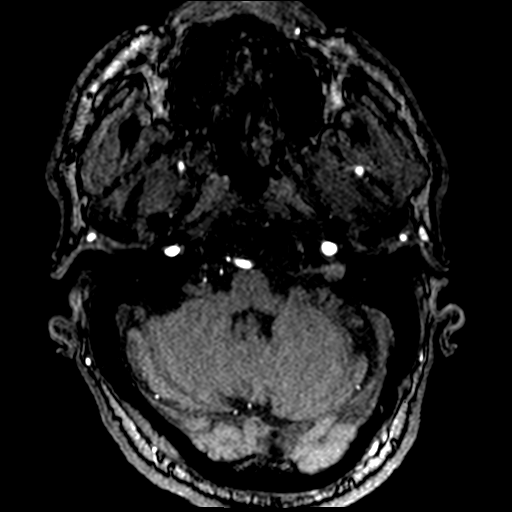
[im 24/186]
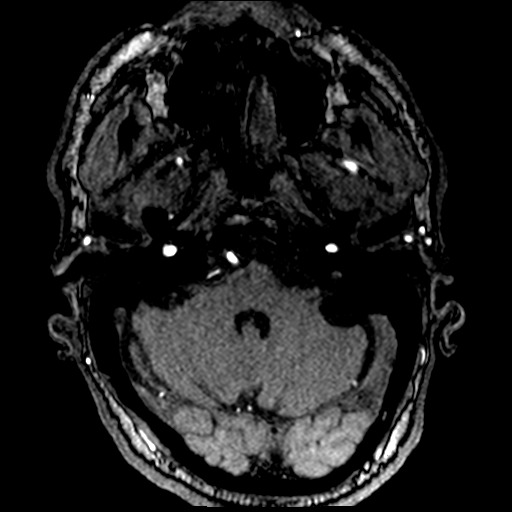
[im 28/186]
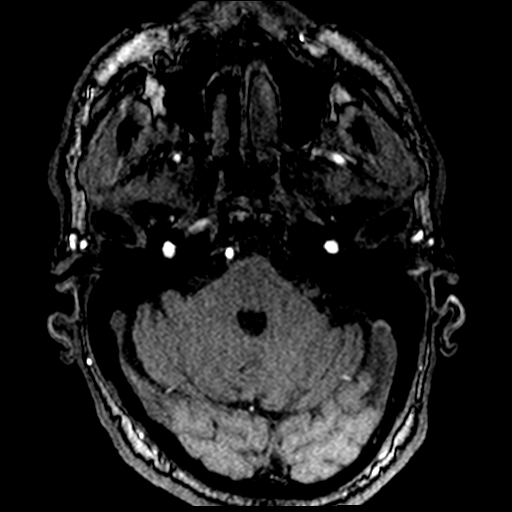
[im 32/186]
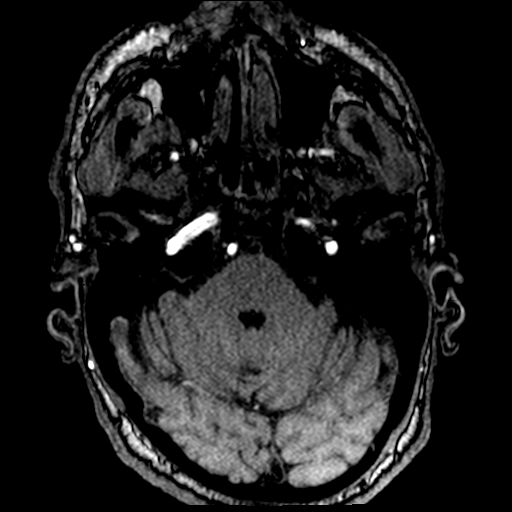
[im 36/186]
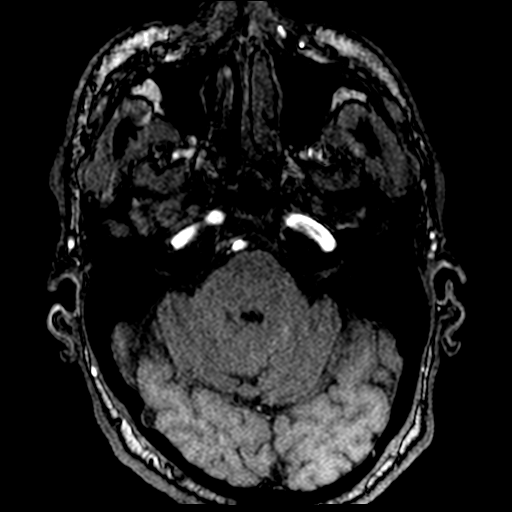
[im 40/186]
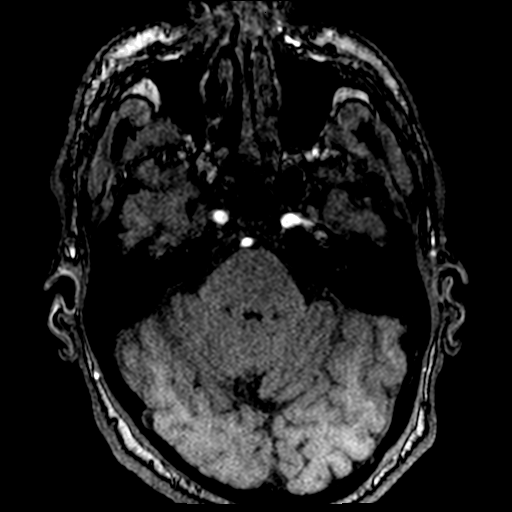
[im 44/186]
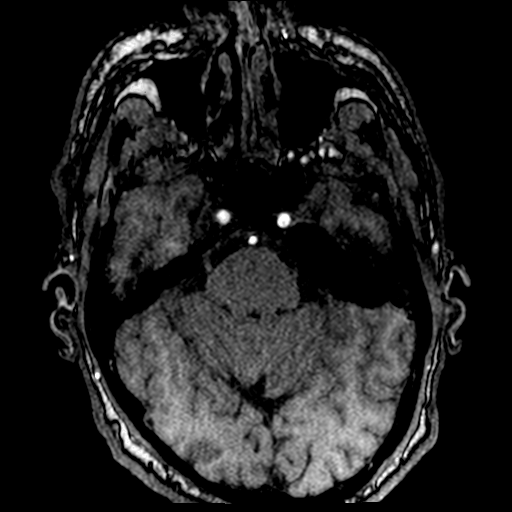
[im 60/186]
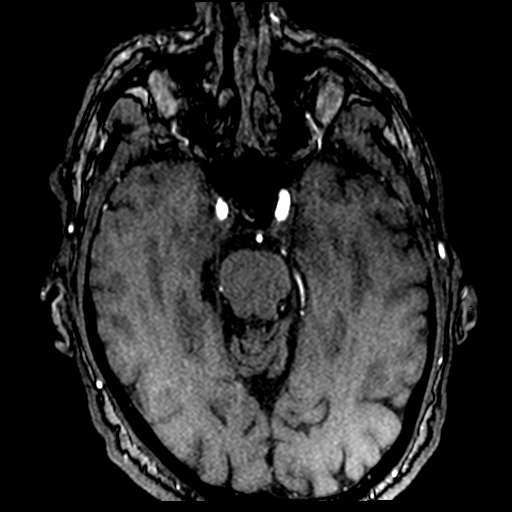
[im 83/186]
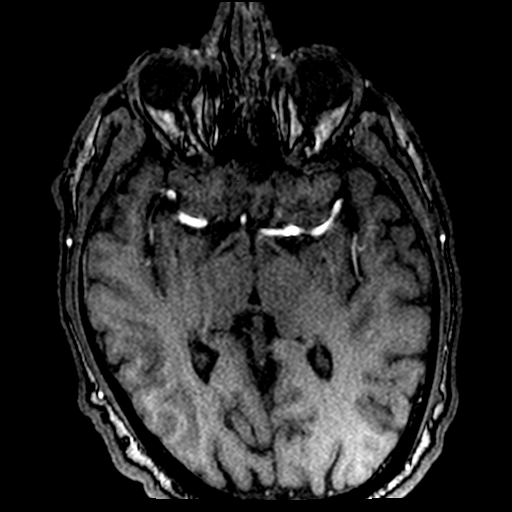
[im 95/186]
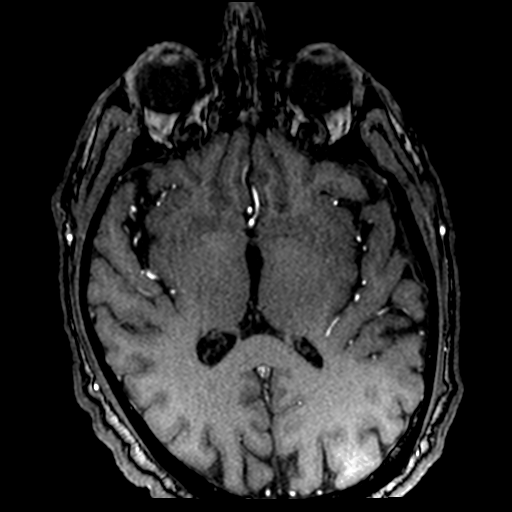
[im 107/186]
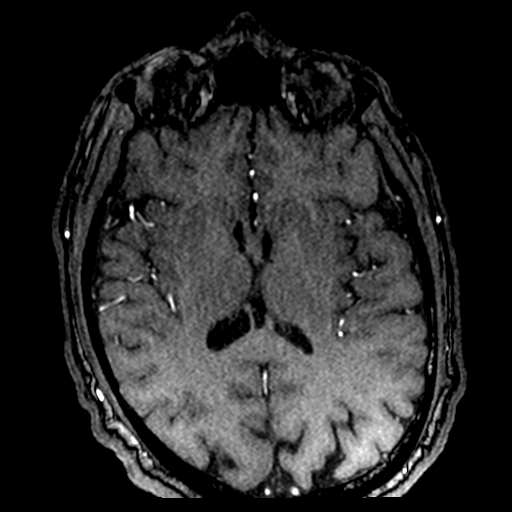
[im 130/186]
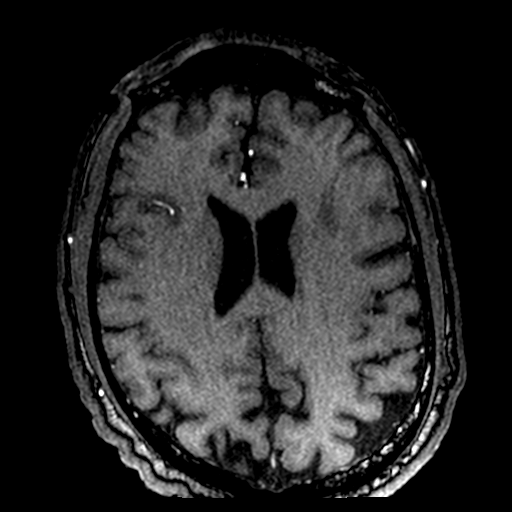
[im 154/186]
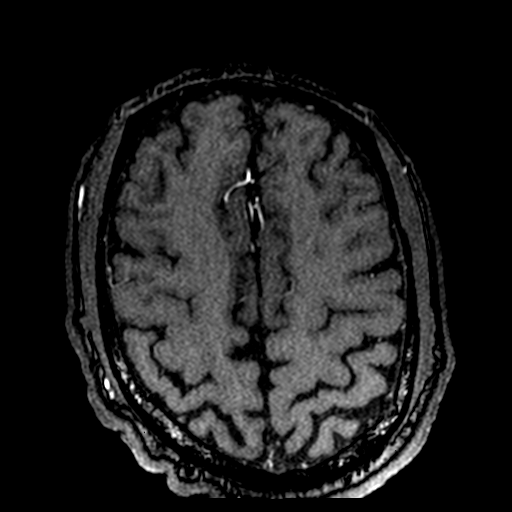
[im 158/186]
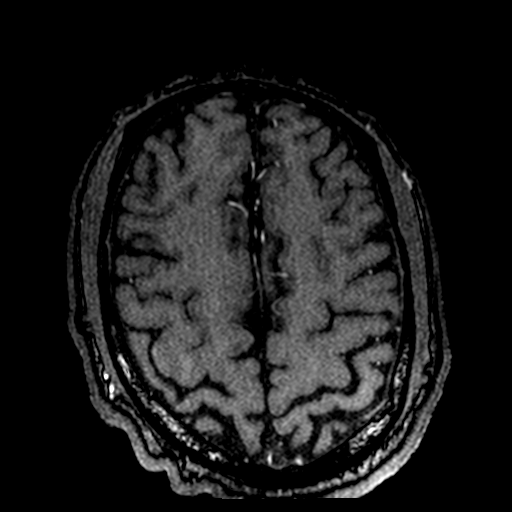
[im 178/186]
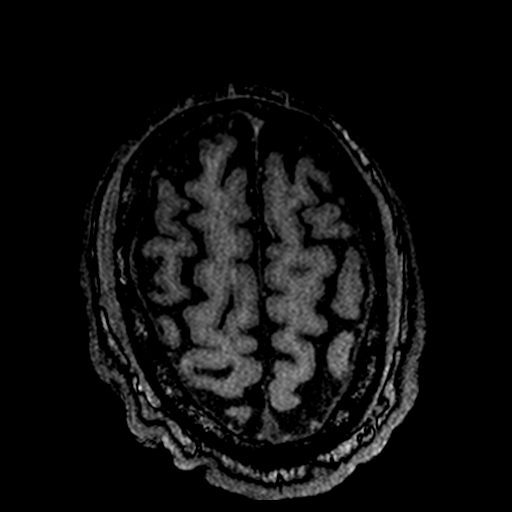

[20 of 48 positions shown; findings below may reference images not displayed]

FINDINGS: The distal portions of both internal carotid arteries appear normal.  

The vertebral arteries and basilar artery appear normal.  

At the Circle of Willis, there is an absent A1 segment on the right.  No stenosis or aneurysm can be identified.  

The anterior cerebral arteries, middle cerebral arteries, and posterior cerebral arteries appear normal.
IMPRESSION: 1. Anatomical variation with absent A1 segment on the right.  

2. No stenosis or aneurysm identified.

## 2023-03-15 IMAGING — MR MRI BRAIN W/O CONTRAST
5 of 8 series · 30 of 48 positions shown · non-contrast
Comparison: none

﻿

Pertinent Hx:    Transient loss of vision in the left eye.  Assess for possible stroke.
TECHNIQUE: T1-weighted, T2-weighted, and FLAIR axial images of the brain were taken.  Gradient echo axial images were also obtained and diffusion-weighted imaging was performed.  T1-weighted sagittal and T2-weighted coronal images were also taken.

[Series 3: T1 · sagittal · 5.0mm · 0.47mm/px · 6 of 24 slices shown]
[im 1/24]
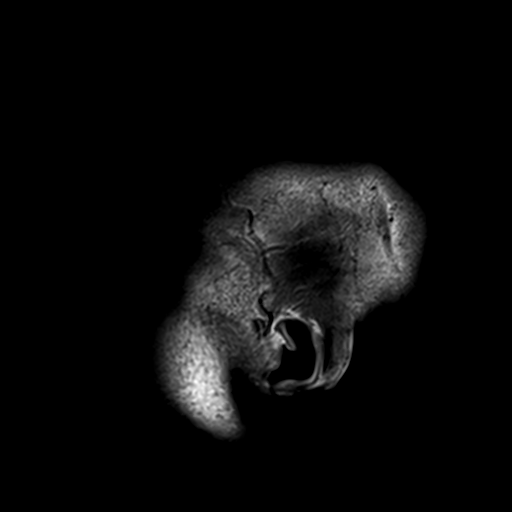
[im 5/24]
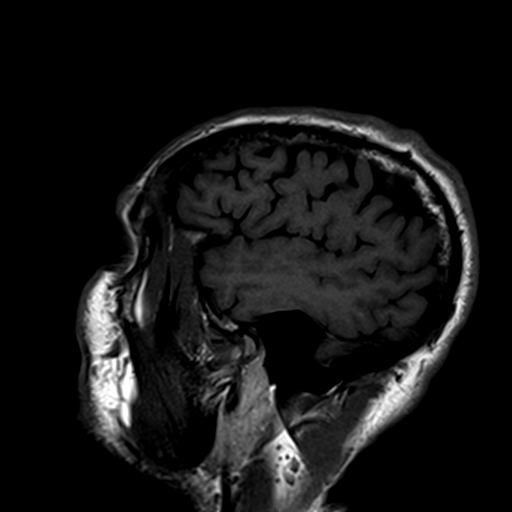
[im 10/24]
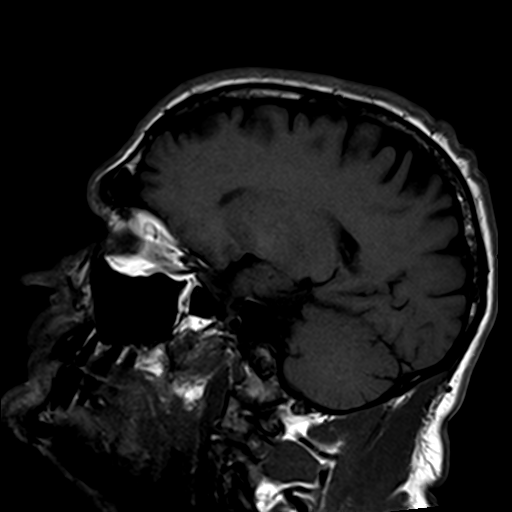
[im 14/24]
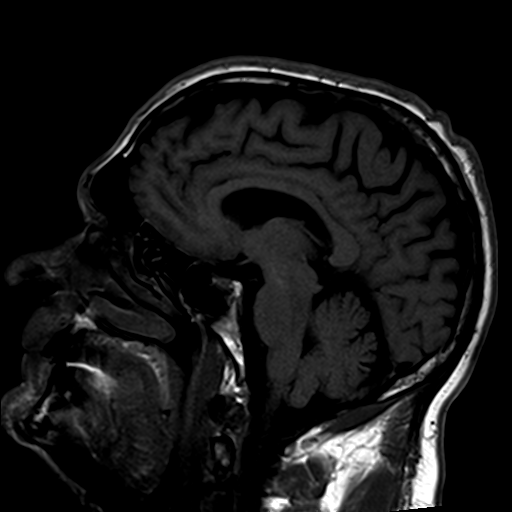
[im 19/24]
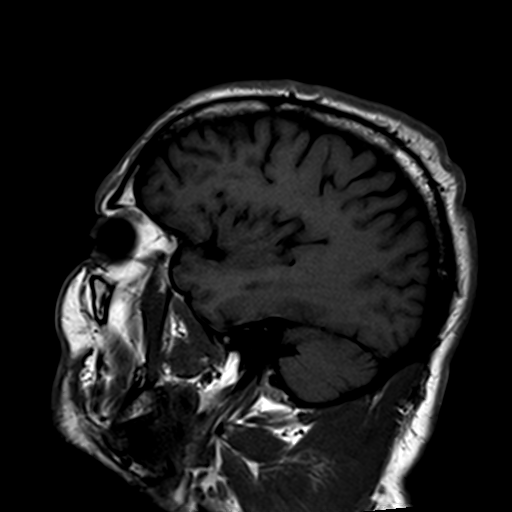
[im 24/24]
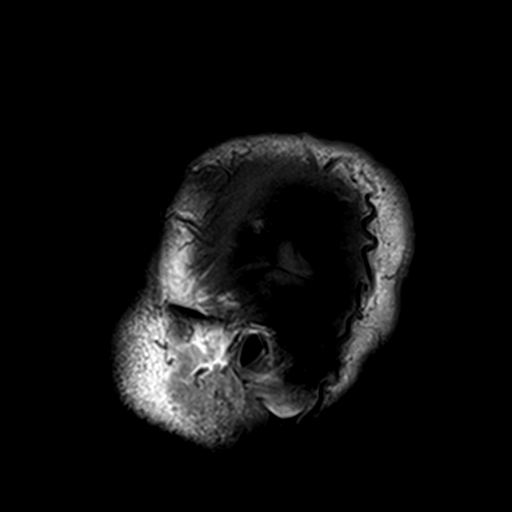

[Series 4: T2 · coronal · 5.0mm · 0.57mm/px · 6 of 29 slices shown (1 of 2)]
[im 1/29]
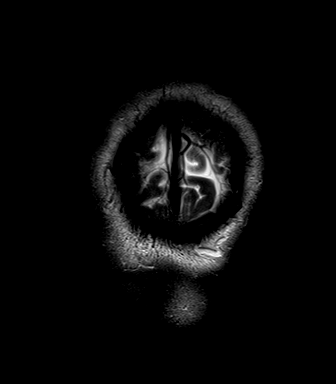
[im 6/29]
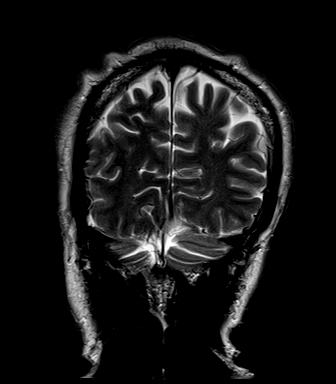
[im 12/29]
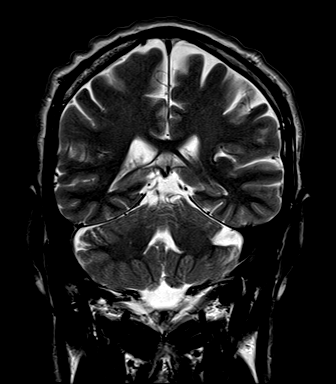
[im 17/29]
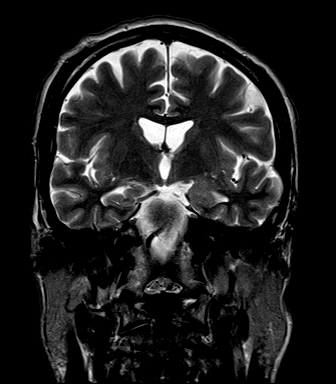
[im 23/29]
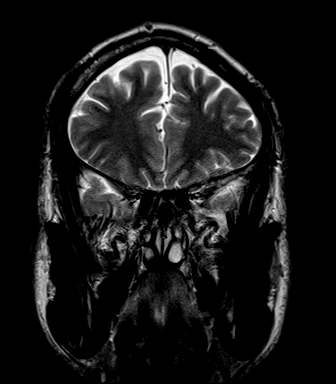
[im 29/29]
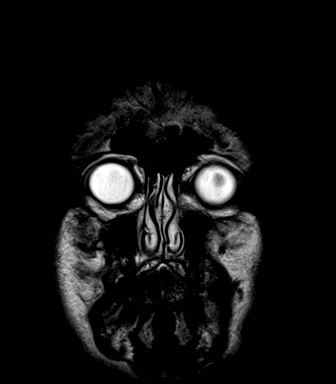

[Series 5: T2 · axial · 5.0mm · 0.57mm/px · z∈[-47,+102]mm · 6 of 29 slices shown (2 of 2)]
[im 1/29]
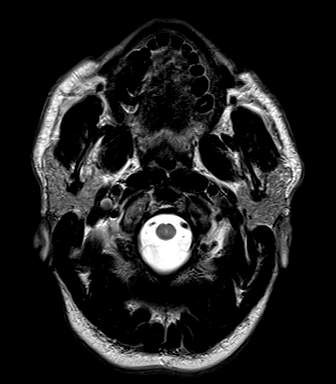
[im 6/29]
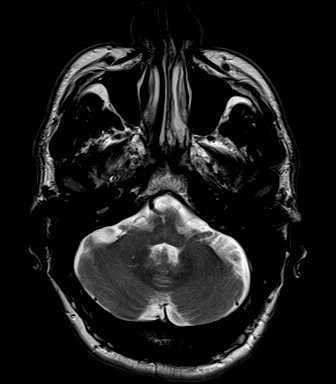
[im 12/29]
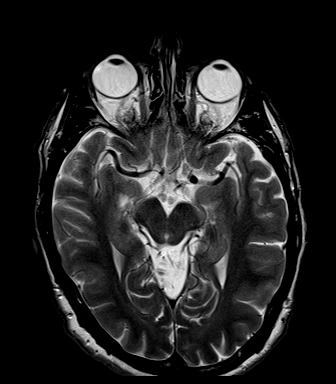
[im 17/29]
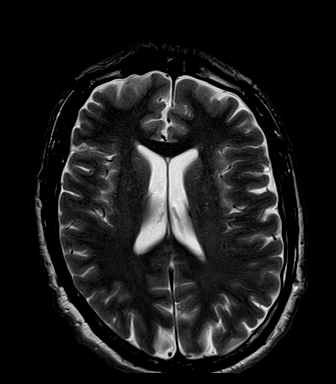
[im 23/29]
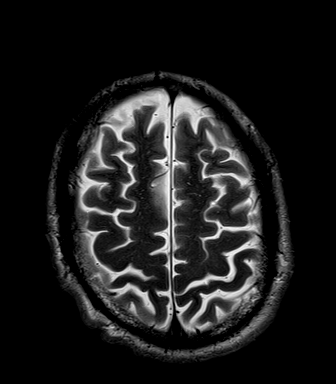
[im 29/29]
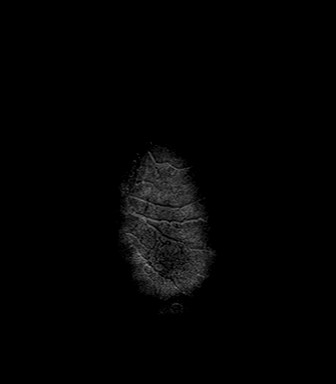

[Series 7: FLAIR · axial · 5.0mm · 0.43mm/px · z∈[-47,+102]mm · 6 of 29 slices shown]
[im 1/29]
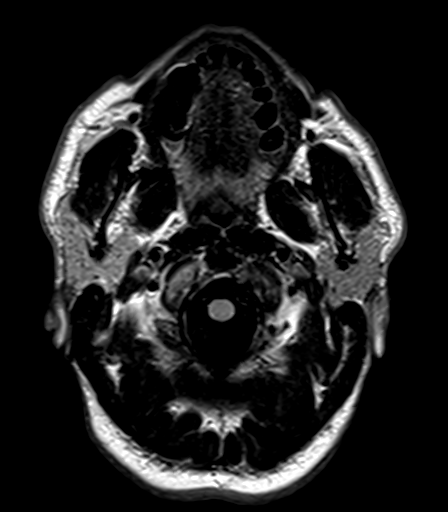
[im 6/29]
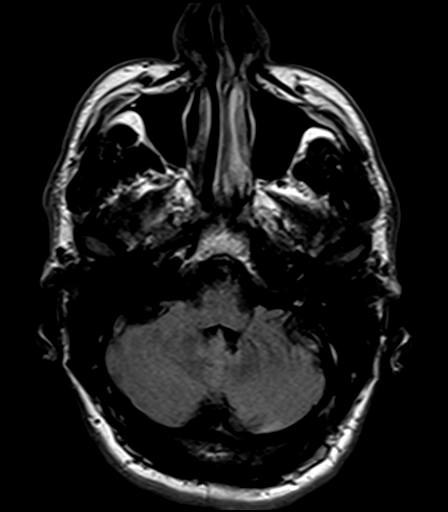
[im 12/29]
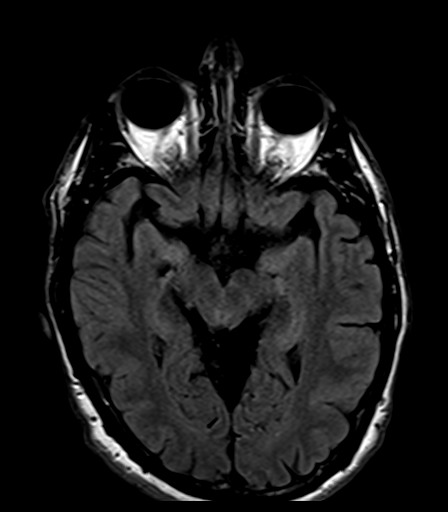
[im 17/29]
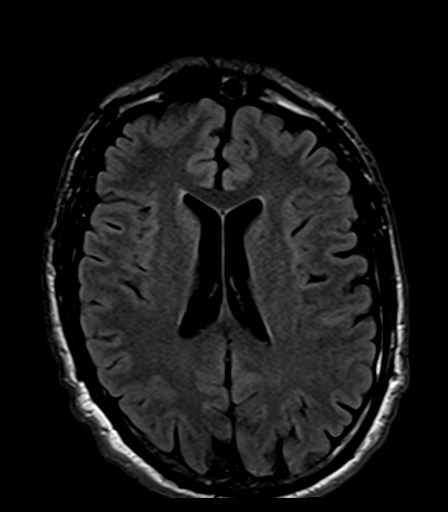
[im 23/29]
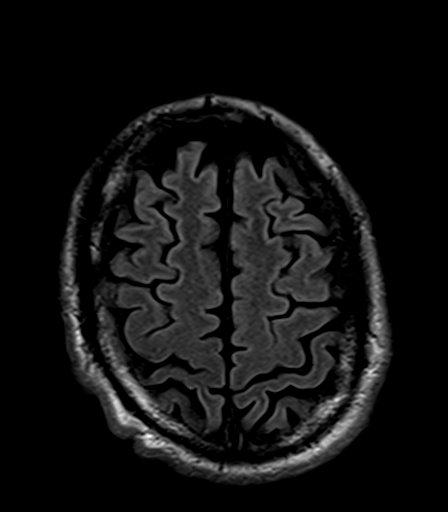
[im 29/29]
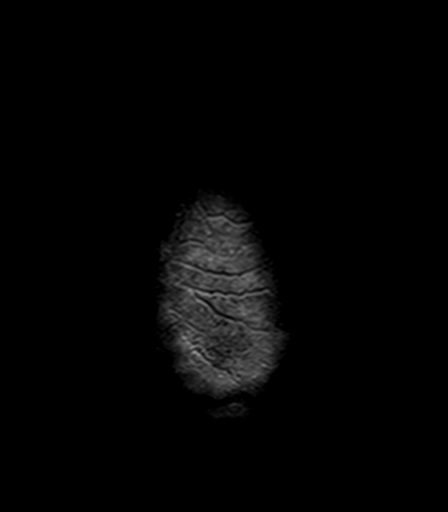

[Series 8: GRE · axial · 5.0mm · 0.57mm/px · z∈[-47,+102]mm · 6 of 29 slices shown]
[im 1/29]
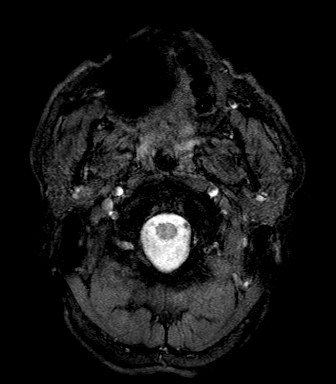
[im 6/29]
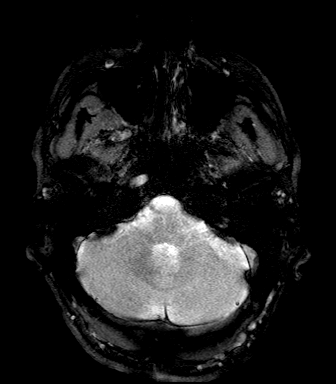
[im 12/29]
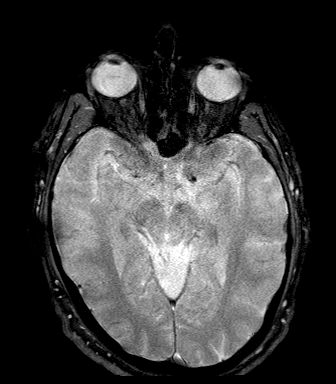
[im 17/29]
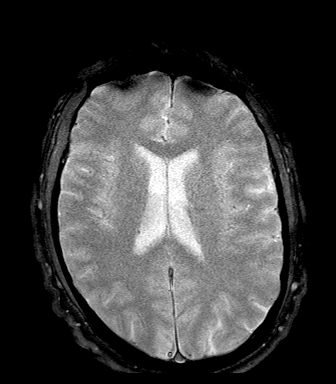
[im 23/29]
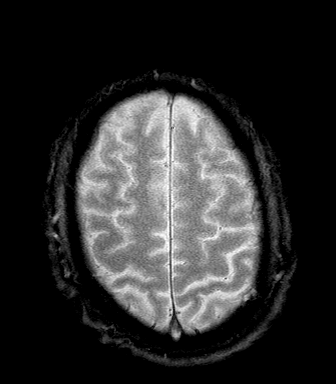
[im 29/29]
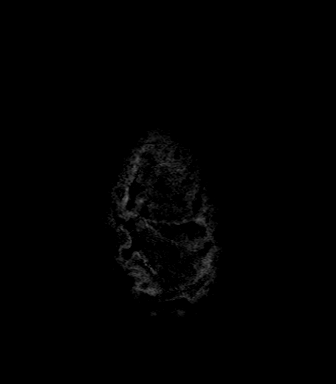

[30 of 48 positions shown; findings below may reference images not displayed]

FINDINGS: The brain appears normal.  No lesion can be identified.  

There is no hemorrhage.  

Diffusion-weighted images appear normal.  No acute changes are evident.  

Ventricles are normal.  Size and position are normal.  There is no mass effect.  

No subdural or subarachnoid blood.  No abnormality identified around the brain.  

No new abnormality compared to a study of 06/28/2022.
IMPRESSION: Negative MRI of the brain with no abnormality identified.

## 2023-03-15 IMAGING — MR MRA NECK WITHOUT CONTRAST
1 series · 45 of 48 positions shown · non-contrast
Comparison: none

﻿

Pertinent Hx:    Transient loss of vision left eye.  Assess for possible stroke.
TECHNIQUE: 3D time of flight images are performed through the extracranial circulation.

[Series 3: (id) carotids · axial · 1.0mm · 0.52mm/px · z∈[-107,+114]mm · 45 of 232 slices shown]
[im 1/232]
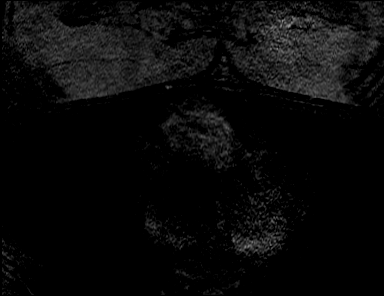
[im 5/232]
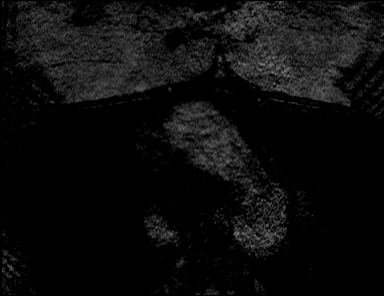
[im 10/232]
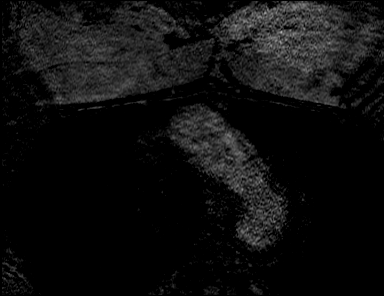
[im 15/232]
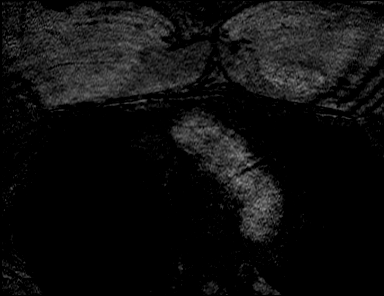
[im 20/232]
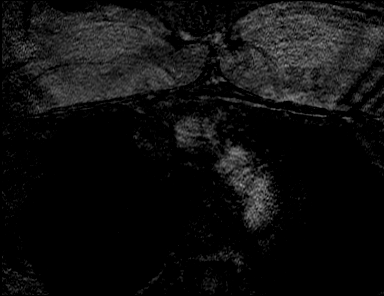
[im 25/232]
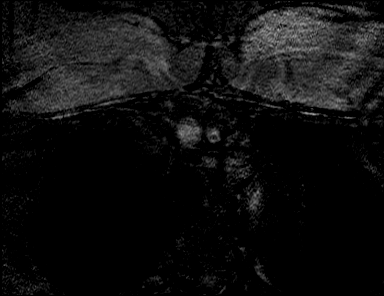
[im 30/232]
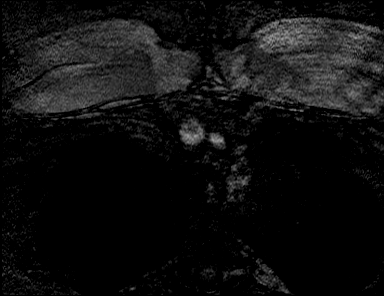
[im 35/232]
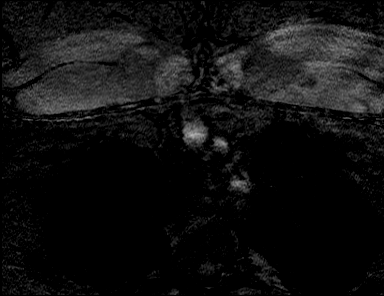
[im 40/232]
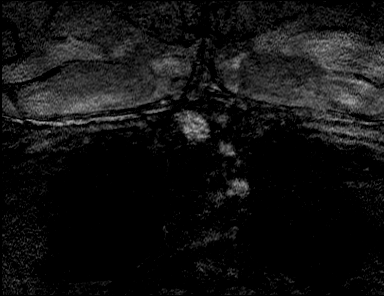
[im 45/232]
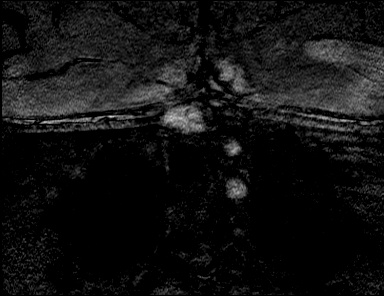
[im 50/232]
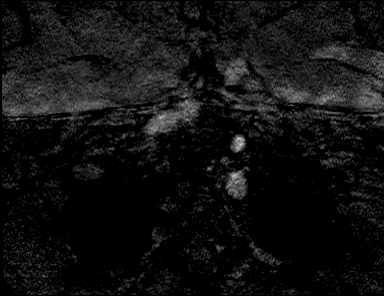
[im 55/232]
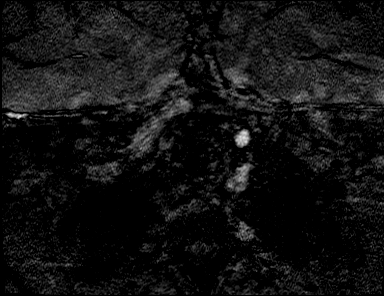
[im 59/232]
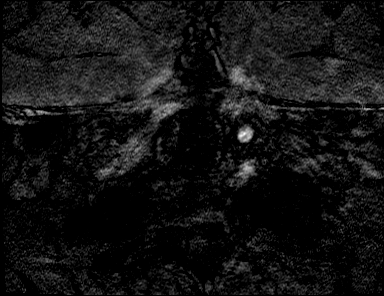
[im 64/232]
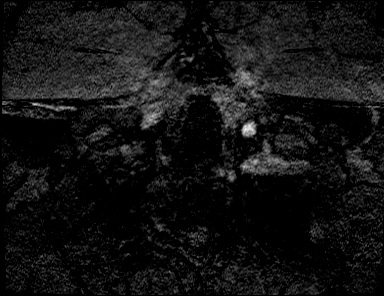
[im 69/232]
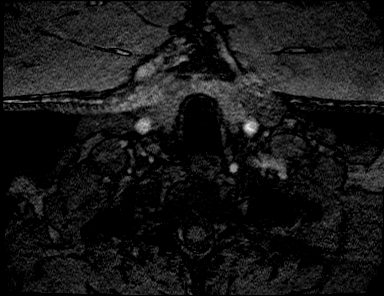
[im 74/232]
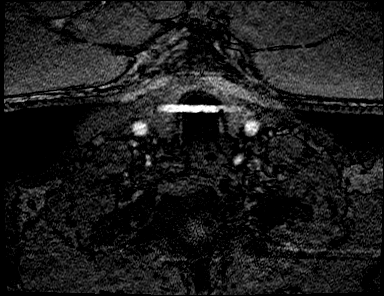
[im 79/232]
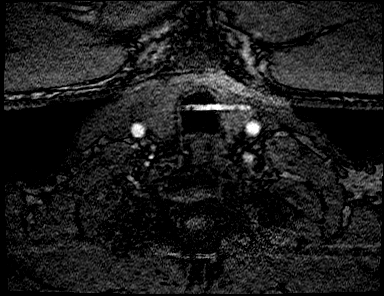
[im 84/232]
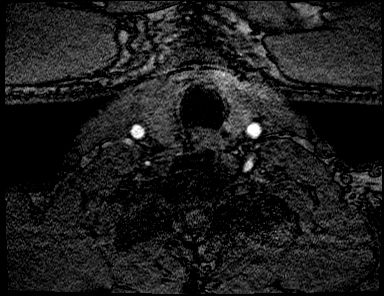
[im 89/232]
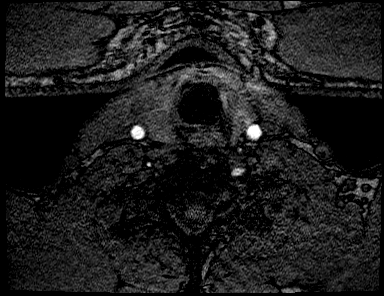
[im 94/232]
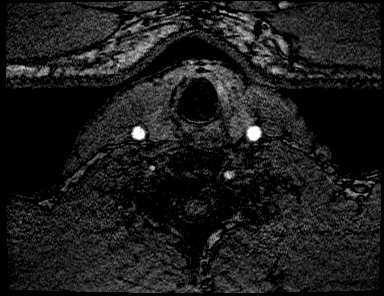
[im 99/232]
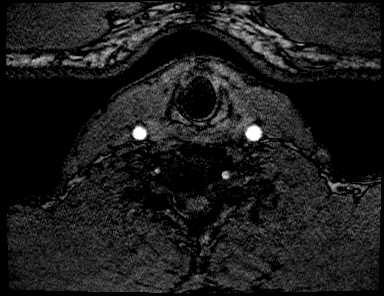
[im 104/232]
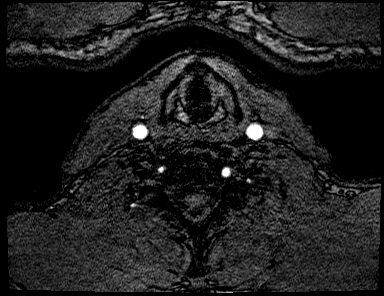
[im 109/232]
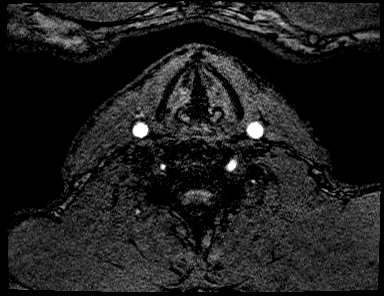
[im 114/232]
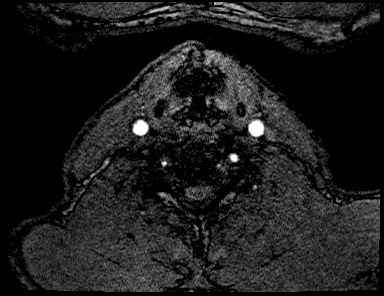
[im 118/232]
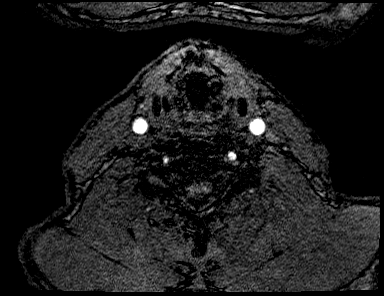
[im 123/232]
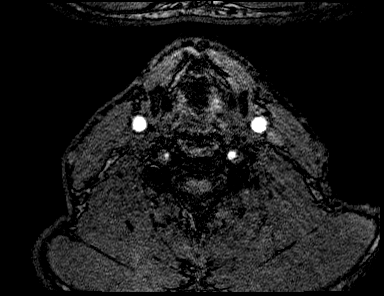
[im 128/232]
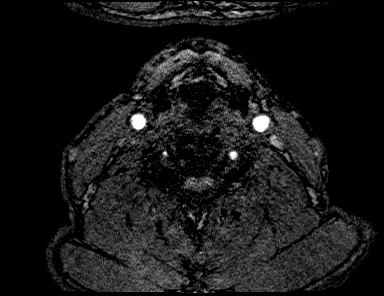
[im 133/232]
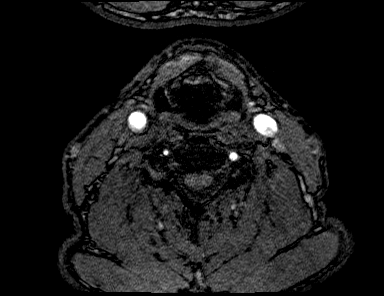
[im 138/232]
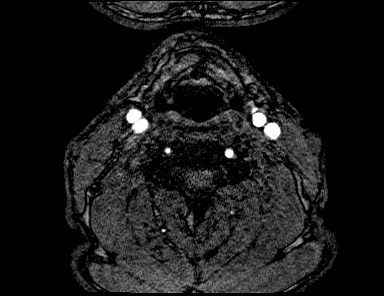
[im 143/232]
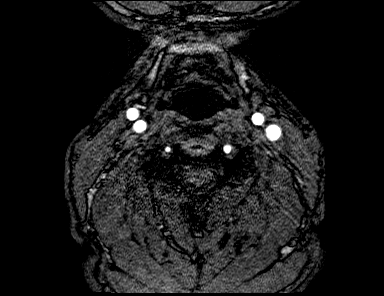
[im 148/232]
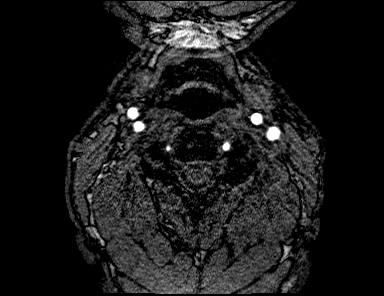
[im 153/232]
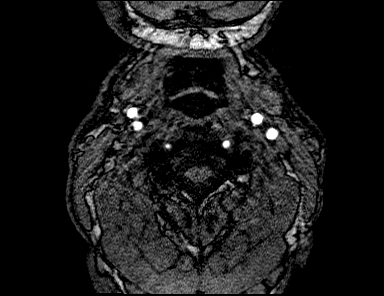
[im 158/232]
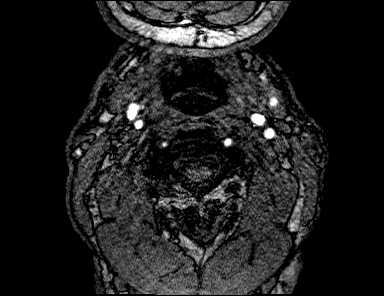
[im 163/232]
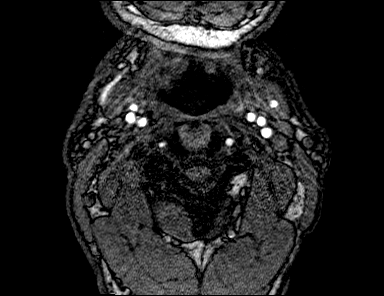
[im 168/232]
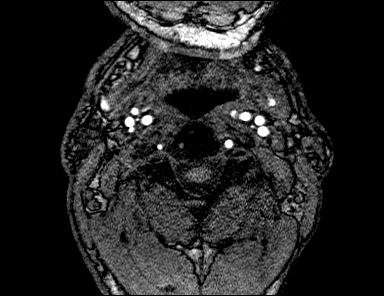
[im 173/232]
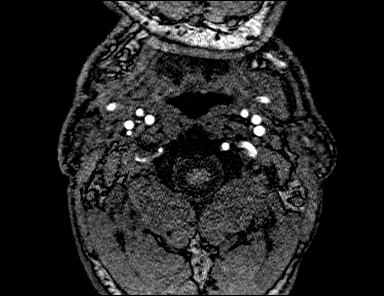
[im 177/232]
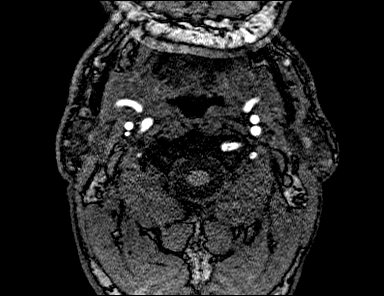
[im 182/232]
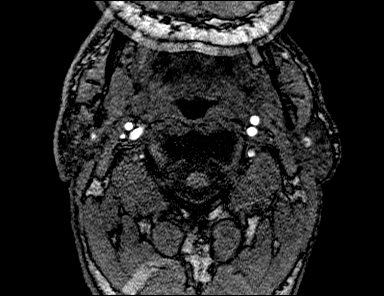
[im 187/232]
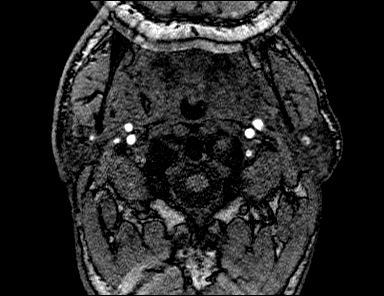
[im 192/232]
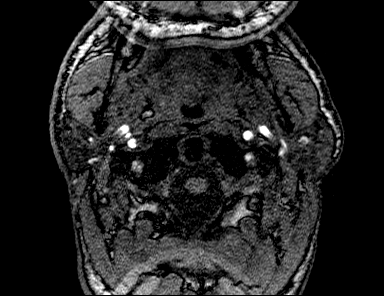
[im 197/232]
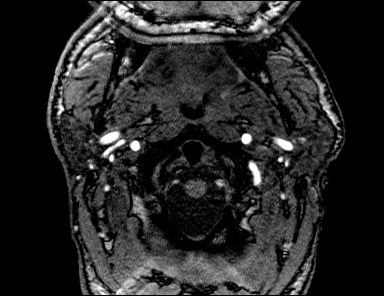
[im 202/232]
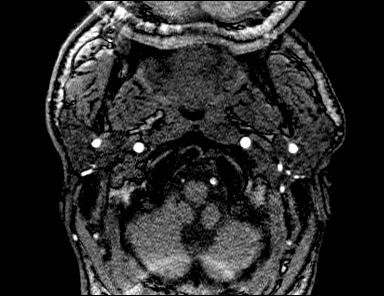
[im 207/232]
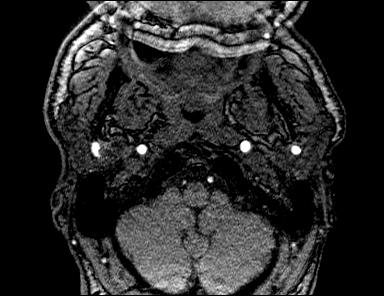
[im 212/232]
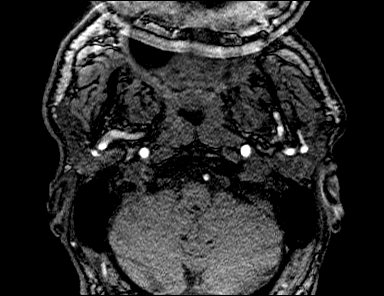
[im 222/232]
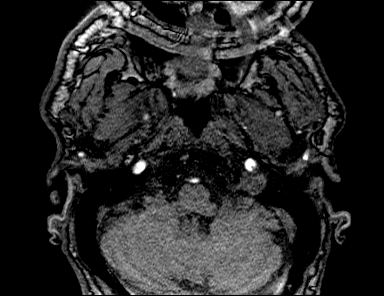

[45 of 48 positions shown; findings below may reference images not displayed]

FINDINGS: Both common carotid arteries appear normal.  At each carotid bifurcation, there is a minor amount of plaque.  There is no significant stenosis on either side.  Both internal carotid arteries appear normal.  

External artery on each side is normal as are its major branches.  

Vertebral arteries appear normal.  The left vertebral artery is dominant.
IMPRESSION: Negative study with no significant stenosis identified.  Patent vessels.  A minor amount of plaque at each carotid bifurcation.
